# Patient Record
Sex: Female | Born: 1970
Health system: Southern US, Community
[De-identification: ages and names within clinical notes are randomized; demographics above are authoritative.]

## PROBLEM LIST (undated history)

## (undated) DIAGNOSIS — I10 Essential (primary) hypertension: Secondary | ICD-10-CM

## (undated) DIAGNOSIS — J45909 Unspecified asthma, uncomplicated: Secondary | ICD-10-CM

## (undated) DIAGNOSIS — E785 Hyperlipidemia, unspecified: Secondary | ICD-10-CM

## (undated) DIAGNOSIS — E119 Type 2 diabetes mellitus without complications: Secondary | ICD-10-CM

## (undated) DIAGNOSIS — E669 Obesity, unspecified: Secondary | ICD-10-CM

## (undated) DIAGNOSIS — M1990 Unspecified osteoarthritis, unspecified site: Secondary | ICD-10-CM

## (undated) HISTORY — PX: ANAL FISSURE REPAIR: SHX2312

## (undated) HISTORY — PX: TUBAL LIGATION: SHX77

---

## 2013-09-18 ENCOUNTER — Emergency Department (HOSPITAL_COMMUNITY)
Admission: EM | Admit: 2013-09-18 | Discharge: 2013-09-19 | Disposition: A | Discharge status: Home / Self Care | Payer: No Typology Code available for payment source | Attending: Emergency Medicine | Admitting: Emergency Medicine

## 2013-09-18 ENCOUNTER — Encounter (HOSPITAL_COMMUNITY): Payer: Self-pay | Admitting: Emergency Medicine

## 2013-09-18 ENCOUNTER — Emergency Department (HOSPITAL_COMMUNITY): Payer: No Typology Code available for payment source

## 2013-09-18 DIAGNOSIS — R609 Edema, unspecified: Secondary | ICD-10-CM | POA: Insufficient documentation

## 2013-09-18 DIAGNOSIS — Z880 Allergy status to penicillin: Secondary | ICD-10-CM | POA: Insufficient documentation

## 2013-09-18 DIAGNOSIS — R079 Chest pain, unspecified: Secondary | ICD-10-CM

## 2013-09-18 DIAGNOSIS — R0789 Other chest pain: Secondary | ICD-10-CM | POA: Insufficient documentation

## 2013-09-18 DIAGNOSIS — R0602 Shortness of breath: Secondary | ICD-10-CM | POA: Insufficient documentation

## 2013-09-18 DIAGNOSIS — E669 Obesity, unspecified: Secondary | ICD-10-CM | POA: Insufficient documentation

## 2013-09-18 HISTORY — DX: Obesity, unspecified: E66.9

## 2013-09-18 LAB — CBC
HEMATOCRIT: 31.9 % — AB (ref 36.0–46.0)
Hemoglobin: 10.1 g/dL — ABNORMAL LOW (ref 12.0–15.0)
MCH: 27.9 pg (ref 26.0–34.0)
MCHC: 31.7 g/dL (ref 30.0–36.0)
MCV: 88.1 fL (ref 78.0–100.0)
Platelets: 250 10*3/uL (ref 150–400)
RBC: 3.62 MIL/uL — ABNORMAL LOW (ref 3.87–5.11)
RDW: 15.1 % (ref 11.5–15.5)
WBC: 8.8 10*3/uL (ref 4.0–10.5)

## 2013-09-18 LAB — I-STAT TROPONIN, ED: Troponin i, poc: 0.01 ng/mL (ref 0.00–0.08)

## 2013-09-18 LAB — PRO B NATRIURETIC PEPTIDE: Pro B Natriuretic peptide (BNP): 71 pg/mL (ref 0–125)

## 2013-09-18 MED ORDER — ASPIRIN 81 MG PO CHEW
324.0000 mg | CHEWABLE_TABLET | Freq: Once | ORAL | Status: AC
  Administered 2013-09-18: 324 mg via ORAL
  Filled 2013-09-18: qty 4

## 2013-09-18 NOTE — ED Notes (Signed)
Pt. reports intermittent left chest pain with SOB and occasional productive cough onset yesterday . No nausea or diaphoresis .

## 2013-09-18 NOTE — ED Notes (Signed)
Pt ambulated around POD A with slow, steady, balanced gait with no difficulty. Denies nausea or dizziness. O2 97% room air.

## 2013-09-18 NOTE — ED Provider Notes (Signed)
CSN: [id number]     Arrival date & time 09/18/13  2201 History   First MD Initiated Contact with Patient 09/18/13 2301     Chief Complaint  Patient presents with  . Chest Pain     (Consider location/radiation/quality/duration/timing/severity/associated sxs/prior Treatment) HPI Comments: 43 y/o comes in with cc of chest pain, shortness of breath. No medical hx, but reports not seeing a doctor in a while. Chest pain is left sided,   Patient is a 43 y.o. female presenting with chest pain. The history is provided by the patient.  Chest Pain Associated symptoms: no abdominal pain, no headache, no nausea, no shortness of breath and not vomiting     Past Medical History  Diagnosis Date  . Obesity    History reviewed. No pertinent past surgical history. No family history on file. History  Substance Use Topics  . Smoking status: Never Smoker   . Smokeless tobacco: Not on file  . Alcohol Use: No   OB History   Grav Para Term Preterm Abortions TAB SAB Ect Mult Living                 Review of Systems  Constitutional: Positive for activity change.  Respiratory: Negative for shortness of breath.   Cardiovascular: Positive for chest pain.  Gastrointestinal: Negative for nausea, vomiting and abdominal pain.  Genitourinary: Negative for dysuria.  Musculoskeletal: Negative for neck pain.  Neurological: Negative for headaches.  All other systems reviewed and are negative.     Allergies  Penicillins  Home Medications   Prior to Admission medications   Not on File   BP 108/62  Pulse 84  Temp(Src) 99.1 F (37.3 C) (Oral)  Resp 18  Ht 5\' 3"  (1.6 m)  Wt 300 lb (136.079 kg)  BMI 53.16 kg/m2  SpO2 96%  LMP 08/26/2013 Physical Exam  Constitutional: She is oriented to person, place, and time. She appears well-developed and well-nourished.  HENT:  Head: Normocephalic and atraumatic.  Eyes: EOM are normal. Pupils are equal, round, and reactive to light.  Neck: Neck supple.   Cardiovascular: Normal rate, regular rhythm and normal heart sounds.   No murmur heard. Pulmonary/Chest: Effort normal. No respiratory distress. She has no wheezes.  Abdominal: Soft. She exhibits no distension. There is no tenderness. There is no rebound and no guarding.  Musculoskeletal: She exhibits edema.  2+ pitting edema bilaterally  Neurological: She is alert and oriented to person, place, and time.  Skin: Skin is warm and dry.    ED Course  Procedures (including critical care time) Labs Review Labs Reviewed  CBC - Abnormal; Notable for the following:    RBC 3.62 (*)    Hemoglobin 10.1 (*)    HCT 31.9 (*)    All other components within normal limits  PRO B NATRIURETIC PEPTIDE  TROPONIN I  BASIC METABOLIC PANEL  I-STAT TROPOININ, ED    Imaging Review Dg Chest 2 View  09/18/2013   CLINICAL DATA:  Chest pain, shortness of breath, bilateral leg swelling.  EXAM: CHEST  2 VIEW  COMPARISON:  None.  FINDINGS: The heart size and mediastinal contours are within normal limits. Both lungs are clear. The visualized skeletal structures are unremarkable.  IMPRESSION: No active cardiopulmonary disease.   Electronically Signed   By: Lucienne Capers M.D.   On: 09/18/2013 22:59     EKG Interpretation   Date/Time:  Wednesday September 18 2013 22:14:55 EDT Ventricular Rate:  84 PR Interval:  126 QRS Duration: 106  QT Interval:  402 QTC Calculation: 475 R Axis:   28 Text Interpretation:  Normal sinus rhythm Incomplete right bundle branch  block Borderline ECG Confirmed by Kathrynn Humble, MD, Thelma Comp ([id number]) on 09/18/2013  11:38:06 PM      MDM   Final diagnoses:  Chest pain  Pitting edema   Pt comes in with cc of chest pain. Atypical chest pain. HEART score is 1 - for atypical chest pain. She has pitting edema and some exertional dyspnea as well - with the latter starting today, and the former worse today than in the past. Lung exam is clear, and BNP is 71. EKG shows no acute  findings.  Pt has insurance, and PCP - just doesn't see a doctor.  I think the best approach would be for patient to see her pcp, and get an echo. She will get lasix to help with the pitting edema, that i think could be due to venous stasis. She has been advised to use stocking as well. Return to the ER if the symptoms get worse.  Varney Biles, MD 09/19/13 [phone]

## 2013-09-19 LAB — I-STAT CHEM 8, ED
BUN: 10 mg/dL (ref 6–23)
CALCIUM ION: 1.17 mmol/L (ref 1.12–1.23)
CHLORIDE: 96 meq/L (ref 96–112)
Creatinine, Ser: 1 mg/dL (ref 0.50–1.10)
GLUCOSE: 149 mg/dL — AB (ref 70–99)
HCT: 32 % — ABNORMAL LOW (ref 36.0–46.0)
Hemoglobin: 10.9 g/dL — ABNORMAL LOW (ref 12.0–15.0)
Potassium: 3.8 mEq/L (ref 3.7–5.3)
Sodium: 136 mEq/L — ABNORMAL LOW (ref 137–147)
TCO2: 25 mmol/L (ref 0–100)

## 2013-09-19 LAB — TROPONIN I: Troponin I: <0.3 ng/mL (ref <0.30)

## 2013-09-19 LAB — BASIC METABOLIC PANEL
BUN: 10 mg/dL (ref 6–23)
CO2: 24 mEq/L (ref 19–32)
CREATININE: 0.88 mg/dL (ref 0.50–1.10)
Calcium: 9 mg/dL (ref 8.4–10.5)
Chloride: 96 mEq/L (ref 96–112)
GFR, EST NON AFRICAN AMERICAN: 81 mL/min — AB (ref >90)
Glucose, Bld: 144 mg/dL — ABNORMAL HIGH (ref 70–99)
Potassium: 4.1 mEq/L (ref 3.7–5.3)
Sodium: 136 mEq/L — ABNORMAL LOW (ref 137–147)

## 2013-09-19 MED ORDER — ASPIRIN EC 81 MG PO TBEC: 81.0000 mg | DELAYED_RELEASE_TABLET | Freq: Every day | ORAL | Status: DC

## 2013-09-19 MED ORDER — FUROSEMIDE 20 MG PO TABS
20.0000 mg | ORAL_TABLET | Freq: Every day | ORAL | Status: DC
Start: 2013-09-19 — End: 2017-09-01

## 2013-09-19 NOTE — Discharge Instructions (Signed)
We saw you in the ER for the chest pain/shortness of breath. All of our cardiac workup is normal, including labs, EKG and chest X-RAY are normal. We are not sure what is causing your discomfort, or your leg swelling. There is no clear evidence of Congestive Heart Failure. The workup in the ER is not complete, and you should follow up with your primary care doctor for further evaluation. Take the medications prescribed. Return to the ER if the symptoms get worse.   Edema Edema is an abnormal build-up of fluids in tissues. Because this is partly dependent on gravity (water flows to the lowest place), it is more common in the legs and thighs (lower extremities). It is also common in the looser tissues, like around the eyes. Painless swelling of the feet and ankles is common and increases as a person ages. It may affect both legs and may include the calves or even thighs. When squeezed, the fluid may move out of the affected area and may leave a dent for a few moments. CAUSES   Prolonged standing or sitting in one place for extended periods of time. Movement helps pump tissue fluid into the veins, and absence of movement prevents this, resulting in edema.  Varicose veins. The valves in the veins do not work as well as they should. This causes fluid to leak into the tissues.  Fluid and salt overload.  Injury, burn, or surgery to the leg, ankle, or foot, may damage veins and allow fluid to leak out.  Sunburn damages vessels. Leaky vessels allow fluid to go out into the sunburned tissues.  Allergies (from insect bites or stings, medications or chemicals) cause swelling by allowing vessels to become leaky.  Protein in the blood helps keep fluid in your vessels. Low protein, as in malnutrition, allows fluid to leak out.  Hormonal changes, including pregnancy and menstruation, cause fluid retention. This fluid may leak out of vessels and cause edema.  Medications that cause fluid retention.  Examples are sex hormones, blood pressure medications, steroid treatment, or anti-depressants.  Some illnesses cause edema, especially heart failure, kidney disease, or liver disease.  Surgery that cuts veins or lymph nodes, such as surgery done for the heart or for breast cancer, may result in edema. DIAGNOSIS  Your caregiver is usually easily able to determine what is causing your swelling (edema) by simply asking what is wrong (getting a history) and examining you (doing a physical). Sometimes x-rays, EKG (electrocardiogram or heart tracing), and blood work may be done to evaluate for underlying medical illness. TREATMENT  General treatment includes:  Leg elevation (or elevation of the affected body part).  Restriction of fluid intake.  Prevention of fluid overload.  Compression of the affected body part. Compression with elastic bandages or support stockings squeezes the tissues, preventing fluid from entering and forcing it back into the blood vessels.  Diuretics (also called water pills or fluid pills) pull fluid out of your body in the form of increased urination. These are effective in reducing the swelling, but can have side effects and must be used only under your caregiver's supervision. Diuretics are appropriate only for some types of edema. The specific treatment can be directed at any underlying causes discovered. Heart, liver, or kidney disease should be treated appropriately. HOME CARE INSTRUCTIONS   Elevate the legs (or affected body part) above the level of the heart, while lying down.  Avoid sitting or standing still for prolonged periods of time.  Avoid putting anything directly  under the knees when lying down, and do not wear constricting clothing or garters on the upper legs.  Exercising the legs causes the fluid to work back into the veins and lymphatic channels. This may help the swelling go down.  The pressure applied by elastic bandages or support stockings can  help reduce ankle swelling.  A low-salt diet may help reduce fluid retention and decrease the ankle swelling.  Take any medications exactly as prescribed. SEEK MEDICAL CARE IF:  Your edema is not responding to recommended treatments. SEEK IMMEDIATE MEDICAL CARE IF:   You develop shortness of breath or chest pain.  You cannot breathe when you lay down; or if, while lying down, you have to get up and go to the window to get your breath.  You are having increasing swelling without relief from treatment.  You develop a fever over 102 F (38.9 C).  You develop pain or redness in the areas that are swollen.  Tell your caregiver right away if you have gained 03 lb/1.4 kg in 1 day or 05 lb/2.3 kg in a week. MAKE SURE YOU:   Understand these instructions.  Will watch your condition.  Will get help right away if you are not doing well or get worse. Document Released: 05/16/2005 Document Revised: 11/15/2011 Document Reviewed: 01/02/2008 [facility] Patient Information 2014 [place].  Chest Pain (Nonspecific) It is often hard to give a specific diagnosis for the cause of chest pain. There is always a chance that your pain could be related to something serious, such as a heart attack or a blood clot in the lungs. You need to follow up with your caregiver for further evaluation. CAUSES   Heartburn.  Pneumonia or bronchitis.  Anxiety or stress.  Inflammation around your heart (pericarditis) or lung (pleuritis or pleurisy).  A blood clot in the lung.  A collapsed lung (pneumothorax). It can develop suddenly on its own (spontaneous pneumothorax) or from injury (trauma) to the chest.  Shingles infection (herpes zoster virus). The chest wall is composed of bones, muscles, and cartilage. Any of these can be the source of the pain.  The bones can be bruised by injury.  The muscles or cartilage can be strained by coughing or overwork.  The cartilage can be affected by inflammation  and become sore (costochondritis). DIAGNOSIS  Lab tests or other studies, such as X-rays, electrocardiography, stress testing, or cardiac imaging, may be needed to find the cause of your pain.  TREATMENT   Treatment depends on what may be causing your chest pain. Treatment may include:  Acid blockers for heartburn.  Anti-inflammatory medicine.  Pain medicine for inflammatory conditions.  Antibiotics if an infection is present.  You may be advised to change lifestyle habits. This includes stopping smoking and avoiding alcohol, caffeine, and chocolate.  You may be advised to keep your head raised (elevated) when sleeping. This reduces the chance of acid going backward from your stomach into your esophagus.  Most of the time, nonspecific chest pain will improve within 2 to 3 days with rest and mild pain medicine. HOME CARE INSTRUCTIONS   If antibiotics were prescribed, take your antibiotics as directed. Finish them even if you start to feel better.  For the next few days, avoid physical activities that bring on chest pain. Continue physical activities as directed.  Do not smoke.  Avoid drinking alcohol.  Only take over-the-counter or prescription medicine for pain, discomfort, or fever as directed by your caregiver.  Follow your caregiver's suggestions  for further testing if your chest pain does not go away.  Keep any follow-up appointments you made. If you do not go to an appointment, you could develop lasting (chronic) problems with pain. If there is any problem keeping an appointment, you must call to reschedule. SEEK MEDICAL CARE IF:   You think you are having problems from the medicine you are taking. Read your medicine instructions carefully.  Your chest pain does not go away, even after treatment.  You develop a rash with blisters on your chest. SEEK IMMEDIATE MEDICAL CARE IF:   You have increased chest pain or pain that spreads to your arm, neck, jaw, back, or  abdomen.  You develop shortness of breath, an increasing cough, or you are coughing up blood.  You have severe back or abdominal pain, feel nauseous, or vomit.  You develop severe weakness, fainting, or chills.  You have a fever. THIS IS AN EMERGENCY. Do not wait to see if the pain will go away. Get medical help at once. Call your local emergency services (911 in [place] Do not drive yourself to the hospital. MAKE SURE YOU:   Understand these instructions.  Will watch your condition.  Will get help right away if you are not doing well or get worse. Document Released: 02/23/2005 Document Revised: 08/08/2011 Document Reviewed: 12/20/2007 [facility] Patient Information 2014 [place].

## 2013-09-19 NOTE — ED Notes (Signed)
Pt A&OX4, ambulatory at discharge with slow, steady and balanced gait, verbalizing no complaints at this time.

## 2013-09-25 ENCOUNTER — Other Ambulatory Visit (HOSPITAL_COMMUNITY): Payer: Self-pay | Admitting: Family Medicine

## 2013-09-25 DIAGNOSIS — R0602 Shortness of breath: Secondary | ICD-10-CM

## 2013-10-03 ENCOUNTER — Ambulatory Visit (HOSPITAL_COMMUNITY)
Admission: RE | Admit: 2013-10-03 | Discharge: 2013-10-03 | Disposition: A | Discharge status: Home / Self Care | Payer: No Typology Code available for payment source | Source: Ambulatory Visit | Attending: Family Medicine | Admitting: Family Medicine

## 2013-10-03 DIAGNOSIS — R0609 Other forms of dyspnea: Secondary | ICD-10-CM | POA: Insufficient documentation

## 2013-10-03 DIAGNOSIS — R079 Chest pain, unspecified: Secondary | ICD-10-CM | POA: Insufficient documentation

## 2013-10-03 DIAGNOSIS — R0989 Other specified symptoms and signs involving the circulatory and respiratory systems: Secondary | ICD-10-CM | POA: Insufficient documentation

## 2013-10-03 DIAGNOSIS — R0602 Shortness of breath: Secondary | ICD-10-CM

## 2013-10-03 NOTE — Progress Notes (Signed)
  Echocardiogram 2D Echocardiogram has been performed.  [place] 10/03/2013, 4:11 PM

## 2016-02-24 ENCOUNTER — Encounter (HOSPITAL_COMMUNITY): Payer: Self-pay | Admitting: Emergency Medicine

## 2016-02-24 ENCOUNTER — Emergency Department (HOSPITAL_COMMUNITY): Payer: No Typology Code available for payment source

## 2016-02-24 ENCOUNTER — Emergency Department (HOSPITAL_COMMUNITY)
Admission: EM | Admit: 2016-02-24 | Discharge: 2016-02-24 | Disposition: A | Discharge status: Home / Self Care | Payer: No Typology Code available for payment source | Attending: Emergency Medicine | Admitting: Emergency Medicine

## 2016-02-24 DIAGNOSIS — I10 Essential (primary) hypertension: Secondary | ICD-10-CM | POA: Insufficient documentation

## 2016-02-24 DIAGNOSIS — Z7984 Long term (current) use of oral hypoglycemic drugs: Secondary | ICD-10-CM | POA: Insufficient documentation

## 2016-02-24 DIAGNOSIS — E1165 Type 2 diabetes mellitus with hyperglycemia: Secondary | ICD-10-CM | POA: Insufficient documentation

## 2016-02-24 DIAGNOSIS — Z7982 Long term (current) use of aspirin: Secondary | ICD-10-CM | POA: Insufficient documentation

## 2016-02-24 DIAGNOSIS — R0789 Other chest pain: Secondary | ICD-10-CM | POA: Insufficient documentation

## 2016-02-24 DIAGNOSIS — Z87891 Personal history of nicotine dependence: Secondary | ICD-10-CM | POA: Insufficient documentation

## 2016-02-24 DIAGNOSIS — Z79899 Other long term (current) drug therapy: Secondary | ICD-10-CM | POA: Insufficient documentation

## 2016-02-24 HISTORY — DX: Unspecified osteoarthritis, unspecified site: M19.90

## 2016-02-24 HISTORY — DX: Type 2 diabetes mellitus without complications: E11.9

## 2016-02-24 HISTORY — DX: Essential (primary) hypertension: I10

## 2016-02-24 HISTORY — DX: Hyperlipidemia, unspecified: E78.5

## 2016-02-24 LAB — BASIC METABOLIC PANEL
Anion gap: 8 (ref 5–15)
BUN: <5 mg/dL — ABNORMAL LOW (ref 6–20)
CHLORIDE: 96 mmol/L — AB (ref 101–111)
CO2: 28 mmol/L (ref 22–32)
CREATININE: 0.75 mg/dL (ref 0.44–1.00)
Calcium: 9.2 mg/dL (ref 8.9–10.3)
Glucose, Bld: 340 mg/dL — ABNORMAL HIGH (ref 65–99)
Potassium: 3.9 mmol/L (ref 3.5–5.1)
SODIUM: 132 mmol/L — AB (ref 135–145)

## 2016-02-24 LAB — I-STAT TROPONIN, ED: TROPONIN I, POC: 0 ng/mL (ref 0.00–0.08)

## 2016-02-24 LAB — CBC
HCT: 33.4 % — ABNORMAL LOW (ref 36.0–46.0)
Hemoglobin: 10.4 g/dL — ABNORMAL LOW (ref 12.0–15.0)
MCH: 28 pg (ref 26.0–34.0)
MCHC: 31.1 g/dL (ref 30.0–36.0)
MCV: 89.8 fL (ref 78.0–100.0)
Platelets: 241 10*3/uL (ref 150–400)
RBC: 3.72 MIL/uL — AB (ref 3.87–5.11)
RDW: 14.7 % (ref 11.5–15.5)
WBC: 8.1 10*3/uL (ref 4.0–10.5)

## 2016-02-24 LAB — D-DIMER, QUANTITATIVE: D-Dimer, Quant: 0.37 ug/mL-FEU (ref 0.00–0.50)

## 2016-02-24 MED ORDER — METFORMIN HCL 500 MG PO TABS: 500.0000 mg | ORAL_TABLET | Freq: Two times a day (BID) | ORAL | 0 refills | Status: DC

## 2016-02-24 MED ORDER — HYDROCHLOROTHIAZIDE 25 MG PO TABS: 25.0000 mg | ORAL_TABLET | Freq: Every day | ORAL | 0 refills | Status: DC

## 2016-02-24 NOTE — ED Provider Notes (Signed)
[facility] DEPT Provider Note   CSN: [id number] Arrival date & time: 02/24/16  1526     History   Chief Complaint Chief Complaint  Patient presents with  . Chest Pain    HPI Desiree Hernandez is a 45 y.o. female.  Chest pain described as heaviness for the past several days c associated bitemporal headache. She complains of dyspnea with exertion but no diaphoresis or nausea. She has hypertension and diabetes but is non-compliant with her medications secondary to financial concerns. Past medical history includes hyperlipidemia and obesity.      Past Medical History:  Diagnosis Date  . Arthritis   . Diabetes mellitus without complication ([place])   . Hyperlipidemia   . Hypertension   . Obesity     There are no active problems to display for this patient.   Past Surgical History:  Procedure Laterality Date  . ANAL FISSURE REPAIR    . TUBAL LIGATION      OB History    No data available       Home Medications    Prior to Admission medications   Medication Sig Start Date End Date Taking? Authorizing Provider  albuterol (VENTOLIN HFA) 108 (90 Base) MCG/ACT inhaler Inhale 1 puff into the lungs every 6 (six) hours as needed for wheezing or shortness of breath.   Yes Historical Provider, MD  ibuprofen (ADVIL,MOTRIN) 200 MG tablet Take 400 mg by mouth every 6 (six) hours as needed (for pain).   Yes Historical Provider, MD  aspirin EC 81 MG tablet Take 1 tablet (81 mg total) by mouth daily. Patient not taking: Reported on 02/24/2016 09/19/13   Varney Biles, MD  furosemide (LASIX) 20 MG tablet Take 1 tablet (20 mg total) by mouth daily. Patient not taking: Reported on 02/24/2016 09/19/13   Varney Biles, MD  hydrochlorothiazide (HYDRODIURIL) 25 MG tablet Take 1 tablet (25 mg total) by mouth daily. 02/24/16   Nat Christen, MD  metFORMIN (GLUCOPHAGE) 500 MG tablet Take 1 tablet (500 mg total) by mouth 2 (two) times daily with a meal. 02/24/16   Nat Christen, MD    Family  History History reviewed. No pertinent family history.  Social History Social History  Substance Use Topics  . Smoking status: Former Smoker    Types: Cigarettes  . Smokeless tobacco: Never Used  . Alcohol use Yes     Comment: occassional     Allergies   Penicillins   Review of Systems Review of Systems  All other systems reviewed and are negative.    Physical Exam Updated Vital Signs BP 134/93   Pulse 72   Temp 98.5 F (36.9 C) (Oral)   Resp 15   Ht 5\' 3"  (1.6 m)   LMP 02/22/2016   SpO2 95%   Physical Exam  Constitutional: She is oriented to person, place, and time.  Obese, no acute distress  HENT:  Head: Normocephalic and atraumatic.  Eyes: Conjunctivae are normal.  Neck: Neck supple.  Cardiovascular: Normal rate and regular rhythm.   Pulmonary/Chest: Effort normal and breath sounds normal.  Abdominal: Soft. Bowel sounds are normal.  Musculoskeletal: Normal range of motion.  Neurological: She is alert and oriented to person, place, and time.  Skin: Skin is warm and dry.  Psychiatric: She has a normal mood and affect. Her behavior is normal.  Nursing note and vitals reviewed.    ED Treatments / Results  Labs (all labs ordered are listed, but only abnormal results are displayed) Labs Reviewed  BASIC  METABOLIC PANEL - Abnormal; Notable for the following:       Result Value   Sodium 132 (*)    Chloride 96 (*)    Glucose, Bld 340 (*)    BUN <5 (*)    All other components within normal limits  CBC - Abnormal; Notable for the following:    RBC 3.72 (*)    Hemoglobin 10.4 (*)    HCT 33.4 (*)    All other components within normal limits  D-DIMER, QUANTITATIVE (NOT AT [facility])  Randolm Idol, ED    EKG  EKG Interpretation  Date/Time:  Wednesday February 24 2016 15:37:59 EDT Ventricular Rate:  84 PR Interval:  134 QRS Duration: 106 QT Interval:  380 QTC Calculation: 449 R Axis:   13 Text Interpretation:  Normal sinus rhythm Normal ECG  Confirmed by Lacinda Axon  MD, Thamara Leger ([id number]) on 02/24/2016 9:21:46 PM       Radiology Dg Chest 2 View  Result Date: 02/24/2016 CLINICAL DATA:  Upper chest pain and shortness of breath for 2 days. EXAM: CHEST  2 VIEW COMPARISON:  09/18/2013 FINDINGS: The heart size and pulmonary vascularity are normal. Slight peribronchial thickening. The lungs are otherwise clear. No bone abnormality. No effusions. IMPRESSION: Slight bronchitic changes. Electronically Signed   By: Lorriane Shire M.D.   On: 02/24/2016 16:01    Procedures Procedures (including critical care time)  Medications Ordered in ED Medications - No data to display   Initial Impression / Assessment and Plan / ED Course  I have reviewed the triage vital signs and the nursing notes.  Pertinent labs & imaging results that were available during my care of the patient were reviewed by me and considered in my medical decision making (see chart for details).  Clinical Course    Patient has multiple cardiac risk factors. Workup including EKG, chest x-ray, troponin all negative. Glucose elevated. These findings were discussed in great detail with the patient and her mother and daughter. Patient understands she is at high risk for a cardiac event. Will start patient on metformin and hydrochlorothiazide. She will get primary care follow-up and/or return if worse.  Final Clinical Impressions(s) / ED Diagnoses   Final diagnoses:  Essential hypertension  Type 2 diabetes mellitus with hyperglycemia, without long-term current use of insulin (HCC)    New Prescriptions New Prescriptions   HYDROCHLOROTHIAZIDE (HYDRODIURIL) 25 MG TABLET    Take 1 tablet (25 mg total) by mouth daily.   METFORMIN (GLUCOPHAGE) 500 MG TABLET    Take 1 tablet (500 mg total) by mouth 2 (two) times daily with a meal.     Nat Christen, MD 02/24/16 [phone]

## 2016-02-24 NOTE — Discharge Instructions (Signed)
Will start medication for diabetes and hypertension. Prescriptions are for 1 month. You must get primary care follow-up. Try to work on your weight. Return if chest pain worsens.

## 2016-02-24 NOTE — ED Triage Notes (Signed)
Pt states she has been having chest pain (heaviness) over the past few days and headache in her temples. Pt has hx of HTN. Pt states she feels SOB. Talking in complete sentences. Pt seen at urgent care and sent here for inconclusive EKG

## 2017-07-23 ENCOUNTER — Encounter (HOSPITAL_COMMUNITY): Payer: Self-pay | Admitting: Emergency Medicine

## 2017-07-23 ENCOUNTER — Emergency Department (HOSPITAL_COMMUNITY): Payer: No Typology Code available for payment source

## 2017-07-23 ENCOUNTER — Emergency Department (HOSPITAL_COMMUNITY)
Admission: EM | Admit: 2017-07-23 | Discharge: 2017-07-23 | Disposition: A | Discharge status: Home / Self Care | Payer: No Typology Code available for payment source | Attending: Emergency Medicine | Admitting: Emergency Medicine

## 2017-07-23 DIAGNOSIS — E119 Type 2 diabetes mellitus without complications: Secondary | ICD-10-CM | POA: Diagnosis not present

## 2017-07-23 DIAGNOSIS — Y939 Activity, unspecified: Secondary | ICD-10-CM | POA: Diagnosis not present

## 2017-07-23 DIAGNOSIS — Z79899 Other long term (current) drug therapy: Secondary | ICD-10-CM | POA: Diagnosis not present

## 2017-07-23 DIAGNOSIS — Z7984 Long term (current) use of oral hypoglycemic drugs: Secondary | ICD-10-CM | POA: Insufficient documentation

## 2017-07-23 DIAGNOSIS — Z7982 Long term (current) use of aspirin: Secondary | ICD-10-CM | POA: Insufficient documentation

## 2017-07-23 DIAGNOSIS — I10 Essential (primary) hypertension: Secondary | ICD-10-CM | POA: Diagnosis not present

## 2017-07-23 DIAGNOSIS — S0081XA Abrasion of other part of head, initial encounter: Secondary | ICD-10-CM | POA: Diagnosis not present

## 2017-07-23 DIAGNOSIS — Z87891 Personal history of nicotine dependence: Secondary | ICD-10-CM | POA: Insufficient documentation

## 2017-07-23 DIAGNOSIS — S27321A Contusion of lung, unilateral, initial encounter: Secondary | ICD-10-CM | POA: Diagnosis not present

## 2017-07-23 DIAGNOSIS — Y999 Unspecified external cause status: Secondary | ICD-10-CM | POA: Diagnosis not present

## 2017-07-23 DIAGNOSIS — S299XXA Unspecified injury of thorax, initial encounter: Secondary | ICD-10-CM | POA: Diagnosis present

## 2017-07-23 DIAGNOSIS — Y929 Unspecified place or not applicable: Secondary | ICD-10-CM | POA: Diagnosis not present

## 2017-07-23 LAB — I-STAT CHEM 8, ED
BUN: 11 mg/dL (ref 6–20)
CALCIUM ION: 1.06 mmol/L — AB (ref 1.15–1.40)
Chloride: 96 mmol/L — ABNORMAL LOW (ref 101–111)
Creatinine, Ser: 0.7 mg/dL (ref 0.44–1.00)
GLUCOSE: 284 mg/dL — AB (ref 65–99)
HCT: 31 % — ABNORMAL LOW (ref 36.0–46.0)
Hemoglobin: 10.5 g/dL — ABNORMAL LOW (ref 12.0–15.0)
Potassium: 4.3 mmol/L (ref 3.5–5.1)
Sodium: 132 mmol/L — ABNORMAL LOW (ref 135–145)
TCO2: 25 mmol/L (ref 22–32)

## 2017-07-23 LAB — BASIC METABOLIC PANEL
Anion gap: 11 (ref 5–15)
BUN: 10 mg/dL (ref 6–20)
CALCIUM: 8.7 mg/dL — AB (ref 8.9–10.3)
CO2: 22 mmol/L (ref 22–32)
CREATININE: 0.79 mg/dL (ref 0.44–1.00)
Chloride: 95 mmol/L — ABNORMAL LOW (ref 101–111)
GFR calc Af Amer: >60 mL/min (ref >60)
Glucose, Bld: 275 mg/dL — ABNORMAL HIGH (ref 65–99)
POTASSIUM: 4.3 mmol/L (ref 3.5–5.1)
Sodium: 128 mmol/L — ABNORMAL LOW (ref 135–145)

## 2017-07-23 LAB — CBC
HCT: 30.5 % — ABNORMAL LOW (ref 36.0–46.0)
Hemoglobin: 9.5 g/dL — ABNORMAL LOW (ref 12.0–15.0)
MCH: 27.6 pg (ref 26.0–34.0)
MCHC: 31.1 g/dL (ref 30.0–36.0)
MCV: 88.7 fL (ref 78.0–100.0)
PLATELETS: 259 10*3/uL (ref 150–400)
RBC: 3.44 MIL/uL — AB (ref 3.87–5.11)
RDW: 15.8 % — AB (ref 11.5–15.5)
WBC: 8.8 10*3/uL (ref 4.0–10.5)

## 2017-07-23 LAB — I-STAT BETA HCG BLOOD, ED (MC, WL, AP ONLY)

## 2017-07-23 MED ORDER — IOPAMIDOL (ISOVUE-300) INJECTION 61%
INTRAVENOUS | Status: AC
  Administered 2017-07-23: 150 mL
  Filled 2017-07-23: qty 150

## 2017-07-23 MED ORDER — TRAMADOL HCL 50 MG PO TABS: 50.0000 mg | ORAL_TABLET | Freq: Two times a day (BID) | ORAL | 0 refills | Status: DC | PRN

## 2017-07-23 MED ORDER — ACETAMINOPHEN 500 MG PO TABS
1000.0000 mg | ORAL_TABLET | Freq: Once | ORAL | Status: AC
Start: 2017-07-23 — End: 2017-07-23
  Administered 2017-07-23: 1000 mg via ORAL
  Filled 2017-07-23: qty 2

## 2017-07-23 NOTE — ED Notes (Signed)
Patient transported to CT 

## 2017-07-23 NOTE — ED Notes (Signed)
ED Provider at bedside. 
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
## 2017-07-23 NOTE — ED Notes (Signed)
Pt understood dc material. NAD Noted. Scripts and work excuse given at [employer]
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
## 2017-07-23 NOTE — Discharge Instructions (Signed)
Take ibuprofen 3 times a day with meals. Take 600 mg (3 pills) at a time.  Do not take other anti-inflammatories at the same time open (Advil, Motrin, naproxen, Aleve).  Take Tylenol 3 times a day. You may take up to 1000 mg with each dose.  Take tramadol as needed for severe pain.  Use the incentive spirometry at least 5 times a day.  Use ice packs or heating pads if this helps control your pain. You will likely have continued muscle stiffness and soreness over the next couple days.   Return to the emergency room if you develop fevers, difficulty breathing, worsening cough, vision changes, vomiting, slurred speech, numbness, loss of bowel or bladder control, or any new or worsening symptoms.

## 2017-07-23 NOTE — ED Notes (Signed)
Discharge vitals in, IV removed and pt getting dressed.
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
## 2017-07-23 NOTE — ED Notes (Signed)
Pt used incentive spirometer x3 at 1100-1250 mL. Pt instructed on at home use.

## 2017-07-29 ENCOUNTER — Emergency Department (HOSPITAL_COMMUNITY)
Admission: EM | Admit: 2017-07-29 | Discharge: 2017-07-29 | Disposition: A | Discharge status: Home / Self Care | Payer: No Typology Code available for payment source | Attending: Emergency Medicine | Admitting: Emergency Medicine

## 2017-07-29 ENCOUNTER — Encounter (HOSPITAL_COMMUNITY): Payer: Self-pay | Admitting: Emergency Medicine

## 2017-07-29 ENCOUNTER — Emergency Department (HOSPITAL_BASED_OUTPATIENT_CLINIC_OR_DEPARTMENT_OTHER)
Admit: 2017-07-29 | Discharge: 2017-07-29 | Disposition: A | Discharge status: Home / Self Care | Payer: No Typology Code available for payment source | Attending: Emergency Medicine | Admitting: Emergency Medicine

## 2017-07-29 ENCOUNTER — Emergency Department (HOSPITAL_COMMUNITY): Payer: No Typology Code available for payment source

## 2017-07-29 ENCOUNTER — Other Ambulatory Visit: Payer: Self-pay

## 2017-07-29 DIAGNOSIS — Y999 Unspecified external cause status: Secondary | ICD-10-CM | POA: Insufficient documentation

## 2017-07-29 DIAGNOSIS — Z79899 Other long term (current) drug therapy: Secondary | ICD-10-CM | POA: Diagnosis not present

## 2017-07-29 DIAGNOSIS — Z87891 Personal history of nicotine dependence: Secondary | ICD-10-CM | POA: Diagnosis not present

## 2017-07-29 DIAGNOSIS — Y939 Activity, unspecified: Secondary | ICD-10-CM | POA: Insufficient documentation

## 2017-07-29 DIAGNOSIS — S42102A Fracture of unspecified part of scapula, left shoulder, initial encounter for closed fracture: Secondary | ICD-10-CM | POA: Diagnosis not present

## 2017-07-29 DIAGNOSIS — Z7982 Long term (current) use of aspirin: Secondary | ICD-10-CM | POA: Diagnosis not present

## 2017-07-29 DIAGNOSIS — J918 Pleural effusion in other conditions classified elsewhere: Secondary | ICD-10-CM | POA: Diagnosis not present

## 2017-07-29 DIAGNOSIS — Y929 Unspecified place or not applicable: Secondary | ICD-10-CM | POA: Diagnosis not present

## 2017-07-29 DIAGNOSIS — M79604 Pain in right leg: Secondary | ICD-10-CM

## 2017-07-29 DIAGNOSIS — J45909 Unspecified asthma, uncomplicated: Secondary | ICD-10-CM | POA: Diagnosis not present

## 2017-07-29 DIAGNOSIS — Z7984 Long term (current) use of oral hypoglycemic drugs: Secondary | ICD-10-CM | POA: Diagnosis not present

## 2017-07-29 DIAGNOSIS — M7989 Other specified soft tissue disorders: Secondary | ICD-10-CM

## 2017-07-29 DIAGNOSIS — I10 Essential (primary) hypertension: Secondary | ICD-10-CM | POA: Diagnosis not present

## 2017-07-29 DIAGNOSIS — R079 Chest pain, unspecified: Secondary | ICD-10-CM | POA: Diagnosis present

## 2017-07-29 DIAGNOSIS — E119 Type 2 diabetes mellitus without complications: Secondary | ICD-10-CM | POA: Insufficient documentation

## 2017-07-29 DIAGNOSIS — R0789 Other chest pain: Secondary | ICD-10-CM

## 2017-07-29 DIAGNOSIS — S27321D Contusion of lung, unilateral, subsequent encounter: Secondary | ICD-10-CM

## 2017-07-29 DIAGNOSIS — J90 Pleural effusion, not elsewhere classified: Secondary | ICD-10-CM

## 2017-07-29 HISTORY — DX: Unspecified asthma, uncomplicated: J45.909

## 2017-07-29 LAB — CBC
HEMATOCRIT: 28.7 % — AB (ref 36.0–46.0)
HEMOGLOBIN: 9 g/dL — AB (ref 12.0–15.0)
MCH: 27.7 pg (ref 26.0–34.0)
MCHC: 31.4 g/dL (ref 30.0–36.0)
MCV: 88.3 fL (ref 78.0–100.0)
Platelets: 298 10*3/uL (ref 150–400)
RBC: 3.25 MIL/uL — ABNORMAL LOW (ref 3.87–5.11)
RDW: 15.4 % (ref 11.5–15.5)
WBC: 5.3 10*3/uL (ref 4.0–10.5)

## 2017-07-29 LAB — COMPREHENSIVE METABOLIC PANEL
ALBUMIN: 3 g/dL — AB (ref 3.5–5.0)
ALT: 12 U/L — ABNORMAL LOW (ref 14–54)
AST: 19 U/L (ref 15–41)
Alkaline Phosphatase: 43 U/L (ref 38–126)
Anion gap: 11 (ref 5–15)
BUN: 9 mg/dL (ref 6–20)
CHLORIDE: 94 mmol/L — AB (ref 101–111)
CO2: 24 mmol/L (ref 22–32)
Calcium: 8.9 mg/dL (ref 8.9–10.3)
Creatinine, Ser: 0.76 mg/dL (ref 0.44–1.00)
GFR calc Af Amer: >60 mL/min (ref >60)
GFR calc non Af Amer: >60 mL/min (ref >60)
GLUCOSE: 287 mg/dL — AB (ref 65–99)
POTASSIUM: 3.9 mmol/L (ref 3.5–5.1)
SODIUM: 129 mmol/L — AB (ref 135–145)
Total Bilirubin: 0.3 mg/dL (ref 0.3–1.2)
Total Protein: 9.1 g/dL — ABNORMAL HIGH (ref 6.5–8.1)

## 2017-07-29 LAB — I-STAT TROPONIN, ED: Troponin i, poc: 0 ng/mL (ref 0.00–0.08)

## 2017-07-29 LAB — I-STAT BETA HCG BLOOD, ED (MC, WL, AP ONLY): I-stat hCG, quantitative: <5 m[IU]/mL (ref <5)

## 2017-07-29 MED ORDER — MORPHINE SULFATE (PF) 4 MG/ML IV SOLN
4.0000 mg | Freq: Once | INTRAVENOUS | Status: AC
Start: 2017-07-29 — End: 2017-07-29
  Administered 2017-07-29: 4 mg via INTRAVENOUS
  Filled 2017-07-29: qty 1

## 2017-07-29 MED ORDER — OXYCODONE-ACETAMINOPHEN 5-325 MG PO TABS: 1.0000 | ORAL_TABLET | Freq: Three times a day (TID) | ORAL | 0 refills | Status: DC | PRN

## 2017-07-29 MED ORDER — OXYCODONE-ACETAMINOPHEN 5-325 MG PO TABS
2.0000 | ORAL_TABLET | Freq: Once | ORAL | Status: AC
  Administered 2017-07-29: 2 via ORAL
  Filled 2017-07-29: qty 2

## 2017-07-29 MED ORDER — SODIUM CHLORIDE 0.9 % IV BOLUS (SEPSIS)
1000.0000 mL | Freq: Once | INTRAVENOUS | Status: AC
  Administered 2017-07-29: 1000 mL via INTRAVENOUS

## 2017-07-29 MED ORDER — HYDROCODONE-ACETAMINOPHEN 5-325 MG PO TABS: 1.0000 | ORAL_TABLET | Freq: Three times a day (TID) | ORAL | 0 refills | Status: DC | PRN

## 2017-07-29 MED ORDER — DOXYCYCLINE HYCLATE 100 MG PO CAPS: 100.0000 mg | ORAL_CAPSULE | Freq: Two times a day (BID) | ORAL | 0 refills | Status: AC

## 2017-07-29 MED ORDER — IOPAMIDOL (ISOVUE-370) INJECTION 76%
INTRAVENOUS | Status: AC
  Administered 2017-07-29: 100 mL
  Filled 2017-07-29: qty 100

## 2017-07-29 MED ORDER — ACETAMINOPHEN 325 MG PO TABS
650.0000 mg | ORAL_TABLET | Freq: Once | ORAL | Status: AC
Start: 2017-07-29 — End: 2017-07-29
  Administered 2017-07-29: 650 mg via ORAL
  Filled 2017-07-29: qty 2

## 2017-07-29 MED ORDER — DOXYCYCLINE HYCLATE 100 MG PO TABS
100.0000 mg | ORAL_TABLET | Freq: Once | ORAL | Status: AC
Start: 2017-07-29 — End: 2017-07-29
  Administered 2017-07-29: 100 mg via ORAL
  Filled 2017-07-29: qty 1

## 2017-07-29 MED ORDER — DOXYCYCLINE HYCLATE 100 MG PO CAPS: 100.0000 mg | ORAL_CAPSULE | Freq: Two times a day (BID) | ORAL | 0 refills | Status: DC
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
## 2017-07-29 NOTE — Progress Notes (Signed)
VASCULAR LAB PRELIMINARY  PRELIMINARY  PRELIMINARY  PRELIMINARY  Right lower extremity venous duplex completed.    Preliminary report:  There is no DVT or SVT noted in the right lower extremity.  Called results to [employer], PA-C  [place], RVT 07/29/2017, 3:28 PM
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
## 2017-07-29 NOTE — Discharge Instructions (Signed)
Please follow-up with Dr. Lorin Mercy in orthopedics for further evaluation and treatment of your scapular fracture. You were given Pain medication which you may take every 8 hours for pain.  Do not drive, work, or operate machinery while you are taking this medication as it can make you drowsy.  You may also continue to take Tylenol and ibuprofen as well for your symptoms, however do not take more than 4000 g of Tylenol in a day.  Please follow-up with your primary care doctor within 1 week as well to check resolution of your symptoms.  Please continue to use your incentive spirometer please also take the antibiotics that you were prescribed until you are completely finished.  I have prescribed a new medication for you today. It is important that when you pick the prescription up you discuss the potential interactions of this medication with other medications you are taking, including over the counter medications, with the pharmacists.   This new medication has potential side effects. Be sure to contact your primary care provider or return to the emergency department if you are experiencing new symptoms that you are unable to tolerate after starting the medication. You need to receive medical evaluation immediately if you start to experience blistering of the skin, rash, swelling, or difficulty breathing as these signs could indicate a more serious medication side effect.   Please return to the emergency department for any chest pain, worsening shortness of breath, worsening pain, fevers, worsening swelling in her legs, or any new or worsening symptoms.

## 2017-08-04 ENCOUNTER — Ambulatory Visit (INDEPENDENT_AMBULATORY_CARE_PROVIDER_SITE_OTHER): Payer: Self-pay | Admitting: Orthopaedic Surgery

## 2017-08-04 ENCOUNTER — Ambulatory Visit (INDEPENDENT_AMBULATORY_CARE_PROVIDER_SITE_OTHER): Payer: Self-pay

## 2017-08-04 ENCOUNTER — Encounter (INDEPENDENT_AMBULATORY_CARE_PROVIDER_SITE_OTHER): Payer: Self-pay | Admitting: Orthopaedic Surgery

## 2017-08-04 VITALS — BP 143/87 | HR 93 | Ht 63.0 in | Wt 250.0 lb

## 2017-08-04 DIAGNOSIS — S40012A Contusion of left shoulder, initial encounter: Secondary | ICD-10-CM

## 2017-08-04 DIAGNOSIS — M25512 Pain in left shoulder: Secondary | ICD-10-CM
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
## 2017-08-10 ENCOUNTER — Other Ambulatory Visit: Payer: Self-pay

## 2017-08-10 ENCOUNTER — Ambulatory Visit: Payer: Self-pay | Attending: Family Medicine | Admitting: Physician Assistant

## 2017-08-10 VITALS — BP 120/84 | HR 89 | Temp 98.4°F | Resp 16 | Ht 63.0 in | Wt 252.2 lb

## 2017-08-10 DIAGNOSIS — E119 Type 2 diabetes mellitus without complications: Secondary | ICD-10-CM

## 2017-08-10 DIAGNOSIS — Z7984 Long term (current) use of oral hypoglycemic drugs: Secondary | ICD-10-CM | POA: Insufficient documentation

## 2017-08-10 DIAGNOSIS — Z7982 Long term (current) use of aspirin: Secondary | ICD-10-CM | POA: Insufficient documentation

## 2017-08-10 DIAGNOSIS — E1165 Type 2 diabetes mellitus with hyperglycemia: Secondary | ICD-10-CM | POA: Insufficient documentation

## 2017-08-10 DIAGNOSIS — E871 Hypo-osmolality and hyponatremia: Secondary | ICD-10-CM

## 2017-08-10 DIAGNOSIS — Z87828 Personal history of other (healed) physical injury and trauma: Secondary | ICD-10-CM | POA: Insufficient documentation

## 2017-08-10 DIAGNOSIS — Z8249 Family history of ischemic heart disease and other diseases of the circulatory system: Secondary | ICD-10-CM | POA: Insufficient documentation

## 2017-08-10 DIAGNOSIS — R9389 Abnormal findings on diagnostic imaging of other specified body structures: Secondary | ICD-10-CM

## 2017-08-10 DIAGNOSIS — M1990 Unspecified osteoarthritis, unspecified site: Secondary | ICD-10-CM | POA: Insufficient documentation

## 2017-08-10 DIAGNOSIS — I2510 Atherosclerotic heart disease of native coronary artery without angina pectoris: Secondary | ICD-10-CM | POA: Insufficient documentation

## 2017-08-10 DIAGNOSIS — E785 Hyperlipidemia, unspecified: Secondary | ICD-10-CM | POA: Insufficient documentation

## 2017-08-10 DIAGNOSIS — J45909 Unspecified asthma, uncomplicated: Secondary | ICD-10-CM | POA: Insufficient documentation

## 2017-08-10 LAB — POCT CBG (FASTING - GLUCOSE)-MANUAL ENTRY: GLUCOSE FASTING, POC: 301 mg/dL — AB (ref 70–99)

## 2017-08-10 MED ORDER — TRUEPLUS LANCETS 28G MISC: 28.0000 g | Freq: Four times a day (QID) | 3 refills | Status: AC

## 2017-08-10 MED ORDER — LISINOPRIL 5 MG PO TABS: 5.0000 mg | ORAL_TABLET | Freq: Every day | ORAL | 3 refills | Status: DC

## 2017-08-10 MED ORDER — ASPIRIN EC 81 MG PO TBEC: 81.0000 mg | DELAYED_RELEASE_TABLET | Freq: Every day | ORAL | 1 refills | Status: AC

## 2017-08-10 MED ORDER — TRUE METRIX METER DEVI: 1.0000 | Freq: Four times a day (QID) | 0 refills | Status: AC

## 2017-08-10 MED ORDER — METFORMIN HCL 500 MG PO TABS: 500.0000 mg | ORAL_TABLET | Freq: Two times a day (BID) | ORAL | 3 refills | Status: DC

## 2017-08-10 MED ORDER — ATORVASTATIN CALCIUM 20 MG PO TABS: 20.0000 mg | ORAL_TABLET | Freq: Every day | ORAL | 3 refills | Status: DC

## 2017-08-10 MED ORDER — GLUCOSE BLOOD VI STRP: ORAL_STRIP | 12 refills | Status: AC

## 2017-08-10 MED FILL — TRUE METRIX TEST STRIP: 25 days supply | Qty: 100 | Fill #0

## 2017-08-10 MED FILL — TRUEplus LANCETS 28G MISC: 25 days supply | Qty: 100 | Fill #0

## 2017-08-10 MED FILL — !TRUE METRIX BLOOD GLUCOSE: 1 days supply | Qty: 1 | Fill #0

## 2017-08-10 NOTE — Patient Instructions (Signed)
Pick up a new blood sugar meter form our pharmacy today Aim for 30 minutes of exercise most days. Rethink what you drink. Water is great! Aim for 2-3 Carb Choices per meal (30-45 grams) +/- 1 either way  Aim for 0-15 Carbs per snack if hungry  Include protein in moderation with your meals and snacks  Consider reading food labels for Total Carbohydrate and Fat Grams of foods  Consider checking BG at alternate times per day  Continue taking medication as directed Be mindful about how much sugar you are adding to beverages and other foods. Fruit Punch - find one with no sugar  Measure and decrease portions of carbohydrate foods  Make your plate and don't go back for seconds
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
## 2017-08-23 ENCOUNTER — Ambulatory Visit: Payer: Self-pay | Attending: Family Medicine

## 2017-08-30 ENCOUNTER — Encounter: Payer: PRIVATE HEALTH INSURANCE | Attending: Physician Assistant | Admitting: *Deleted

## 2017-08-30 DIAGNOSIS — IMO0002 Reserved for concepts with insufficient information to code with codable children: Secondary | ICD-10-CM

## 2017-08-30 DIAGNOSIS — E1165 Type 2 diabetes mellitus with hyperglycemia: Secondary | ICD-10-CM

## 2017-08-30 DIAGNOSIS — Z713 Dietary counseling and surveillance: Secondary | ICD-10-CM | POA: Insufficient documentation

## 2017-08-30 DIAGNOSIS — E118 Type 2 diabetes mellitus with unspecified complications: Secondary | ICD-10-CM

## 2017-08-30 DIAGNOSIS — E119 Type 2 diabetes mellitus without complications: Secondary | ICD-10-CM | POA: Insufficient documentation

## 2017-08-30 NOTE — Progress Notes (Signed)
Diabetes Self-Management Education  Visit Type: First/Initial  Appt. Start Time: 1430 Appt. End Time: 1600  08/30/2017  Ms. Desiree Hernandez, identified by name and date of birth, is a 47 y.o. female with a diagnosis of Diabetes: Type 2. Patient states she was in [place] in February and is still recovering. 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   ASSESSMENT  There were no vitals taken for this visit. There is no height or weight on file to calculate BMI.  Diabetes Self-Management Education - 08/30/17 1619      Initial Visit   Diabetes Type  Type 2    Are you currently following a meal plan?  No    Are you taking your medications as prescribed?  Yes    Date Diagnosed  2016      Health Coping   How would you rate your overall health?  Fair      Psychosocial Assessment   Patient Belief/Attitude about Diabetes  Motivated to manage diabetes    Self-management support  Family    Other persons present  Patient;Parent    Patient Concerns  Nutrition/Meal planning;Glycemic Control;Monitoring    Special Needs  None    Learning Readiness  Contemplating    How often do you need to have someone help you when you read instructions, pamphlets, or other written materials from your doctor or pharmacy?  1 - Never    What is the last grade level you completed in school?  college      Pre-Education Assessment   Patient understands the diabetes disease and treatment process.  Needs Review    Patient understands incorporating nutritional management into lifestyle.  Needs Instruction    Patient undertands incorporating physical activity into lifestyle.  Needs Instruction    Patient understands using medications safely.  Needs Instruction    Patient understands monitoring blood glucose, interpreting and using results  Needs Instruction    Patient understands prevention, detection, and treatment of acute complications.  Needs Instruction    Patient understands prevention,  detection, and treatment of chronic complications.  Needs Instruction    Patient understands how to develop strategies to address psychosocial issues.  Needs Instruction    Patient understands how to develop strategies to promote health/change behavior.  Needs Instruction      Complications   Last HgB A1C per patient/outside source  -- not provided by MD    How often do you check your blood sugar?  1-2 times/day    Fasting Blood glucose range (mg/dL)  >200    Postprandial Blood glucose range (mg/dL)  >200    Have you had a dilated eye exam in the past 12 months?  No    Have you had a dental exam in the past 12 months?  No    Are you checking your feet?  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  6    How many minutes per day do you exercise?  15    Total minutes per week of exercise  90      Patient Education   Previous Diabetes Education  No    Disease state   Definition of diabetes, type 1 and 2, and the diagnosis of diabetes;Factors that contribute to the development of diabetes    Nutrition management  Role of diet in the treatment of diabetes and the relationship between the three main macronutrients and blood glucose level;Carbohydrate counting;Food label reading, portion sizes and measuring food.;Reviewed blood glucose goals for pre and post meals and how to evaluate the patients' food intake on their blood glucose level.    Physical activity and exercise   Role of exercise on diabetes management, blood pressure control and cardiac health.;Helped patient identify appropriate exercises in relation to his/her diabetes, diabetes complications and other health issue.    Medications  Reviewed patients medication for diabetes, action, purpose, timing of dose and side effects.    Monitoring  Purpose and frequency of SMBG.;Identified appropriate SMBG and/or A1C goals.    Chronic complications  Relationship between  chronic complications and blood glucose control    Psychosocial adjustment  Role of stress on diabetes      Individualized Goals (developed by patient)   Nutrition  Follow meal plan discussed    Physical Activity  Exercise 3-5 times per week    Medications  take my medication as prescribed    Monitoring   test blood glucose pre and post meals as discussed      Post-Education Assessment   Patient understands the diabetes disease and treatment process.  Demonstrates understanding / competency    Patient understands incorporating nutritional management into lifestyle.  Demonstrates understanding / competency    Patient undertands incorporating physical activity into lifestyle.  Demonstrates understanding / competency    Patient understands using medications safely.  Demonstrates understanding / competency    Patient understands monitoring blood glucose, interpreting and using results  Demonstrates understanding / competency    Patient understands prevention, detection, and treatment of acute complications.  Demonstrates understanding / competency    Patient understands prevention, detection, and treatment of chronic complications.  Demonstrates understanding / competency    Patient understands how to develop strategies to address psychosocial issues.  Demonstrates understanding / competency    Patient understands how to develop strategies to promote health/change behavior.  Demonstrates understanding / competency      Outcomes   Expected Outcomes  Demonstrated interest in learning. Expect positive outcomes    Future DMSE  PRN    Program Status  Completed      Individualized Plan for Diabetes Self-Management Training:   Learning Objective:  Patient will have a greater understanding of diabetes self-management. Patient education plan is to attend individual and/or group sessions per assessed needs and concerns.   Plan:   Patient Instructions  Plan:  Aim for 3 Carb Choices per meal (45  grams) +/- 1 either way  Aim for 0-2 Carbs per snack if hungry  Include protein in moderation with your meals and snacks Consider reading food labels for Total Carbohydrate of foods Consider  increasing your activity level by trying some Arm Chair Exercises or water exercises as tolerated Consider checking BG at alternate times per day   Continue taking medication as directed by MD  Expected Outcomes:  Demonstrated interest in learning. Expect positive outcomes  Education material provided: Living Well with Diabetes, A1C conversion sheet, Meal plan card and Carbohydrate counting sheet, Arm Chair Exercise, Carb Counting APP handout  If problems or questions, patient to contact team via:  Phone  Future DSME appointment: PRN

## 2017-08-30 NOTE — Patient Instructions (Signed)
Plan:  Aim for 3 Carb Choices per meal (45 grams) +/- 1 either way  Aim for 0-2 Carbs per snack if hungry  Include protein in moderation with your meals and snacks Consider reading food labels for Total Carbohydrate of foods Consider  increasing your activity level by trying some Arm Chair Exercises or water exercises as tolerated Consider checking BG at alternate times per day   Continue taking medication as directed by MD

## 2017-09-01 ENCOUNTER — Encounter: Payer: Self-pay | Admitting: Nurse Practitioner

## 2017-09-01 ENCOUNTER — Ambulatory Visit: Payer: Self-pay | Attending: Nurse Practitioner | Admitting: Nurse Practitioner

## 2017-09-01 VITALS — BP 110/73 | HR 85 | Temp 98.4°F | Ht 63.0 in | Wt 249.2 lb

## 2017-09-01 DIAGNOSIS — Z9851 Tubal ligation status: Secondary | ICD-10-CM | POA: Insufficient documentation

## 2017-09-01 DIAGNOSIS — Z7982 Long term (current) use of aspirin: Secondary | ICD-10-CM | POA: Insufficient documentation

## 2017-09-01 DIAGNOSIS — E1165 Type 2 diabetes mellitus with hyperglycemia: Secondary | ICD-10-CM | POA: Insufficient documentation

## 2017-09-01 DIAGNOSIS — J45909 Unspecified asthma, uncomplicated: Secondary | ICD-10-CM | POA: Insufficient documentation

## 2017-09-01 DIAGNOSIS — E669 Obesity, unspecified: Secondary | ICD-10-CM | POA: Insufficient documentation

## 2017-09-01 DIAGNOSIS — Z880 Allergy status to penicillin: Secondary | ICD-10-CM | POA: Insufficient documentation

## 2017-09-01 DIAGNOSIS — I10 Essential (primary) hypertension: Secondary | ICD-10-CM | POA: Insufficient documentation

## 2017-09-01 DIAGNOSIS — Z9889 Other specified postprocedural states: Secondary | ICD-10-CM | POA: Insufficient documentation

## 2017-09-01 DIAGNOSIS — Z79899 Other long term (current) drug therapy: Secondary | ICD-10-CM | POA: Insufficient documentation

## 2017-09-01 DIAGNOSIS — S99829A Other specified injuries of unspecified foot, initial encounter: Secondary | ICD-10-CM

## 2017-09-01 DIAGNOSIS — Z7984 Long term (current) use of oral hypoglycemic drugs: Secondary | ICD-10-CM | POA: Insufficient documentation

## 2017-09-01 DIAGNOSIS — E785 Hyperlipidemia, unspecified: Secondary | ICD-10-CM | POA: Insufficient documentation

## 2017-09-01 DIAGNOSIS — E119 Type 2 diabetes mellitus without complications: Secondary | ICD-10-CM | POA: Insufficient documentation

## 2017-09-01 DIAGNOSIS — Z9114 Patient's other noncompliance with medication regimen: Secondary | ICD-10-CM | POA: Insufficient documentation

## 2017-09-01 LAB — GLUCOSE, POCT (MANUAL RESULT ENTRY): POC Glucose: 249 mg/dl — AB (ref 70–99)

## 2017-09-01 LAB — POCT GLYCOSYLATED HEMOGLOBIN (HGB A1C): Hemoglobin A1C: 11.2

## 2017-09-01 MED ORDER — DOXYCYCLINE HYCLATE 100 MG PO TABS: 100.0000 mg | ORAL_TABLET | Freq: Two times a day (BID) | ORAL | 0 refills | Status: AC

## 2017-09-01 MED ORDER — GLIMEPIRIDE 4 MG PO TABS: 4.0000 mg | ORAL_TABLET | Freq: Every day | ORAL | 1 refills | Status: DC

## 2017-09-01 MED ORDER — SITAGLIPTIN PHOS-METFORMIN HCL 50-1000 MG PO TABS: 1.0000 | ORAL_TABLET | Freq: Two times a day (BID) | ORAL | 3 refills | Status: DC

## 2017-09-01 MED FILL — DOXYCYCLINE 100 MG TABLET: 100 | 7 days supply | Qty: 14 | Fill #0

## 2017-09-01 MED FILL — JANUMET 50-1,000 MG TABLET: 50-1000 | 30 days supply | Qty: 60 | Fill #0

## 2017-09-01 MED FILL — GLIMEPIRIDE 4 MG TABLET: 4 | 30 days supply | Qty: 30 | Fill #0

## 2017-09-01 NOTE — Patient Instructions (Signed)
Diabetes blood sugar goals  Fasting in AM before breakfast which means at least 8 hrs of no eating or drinking) except water or unsweetened coffee or tea): 90-110 2 hrs after meals: < 160,   Hypoglycemia or low blood sugar: < 70 (You should not have hypoglycemia.)  Aim for 30 minutes of exercise most days. Rethink what you drink. Water is great! Aim for 2-3 Carb Choices per meal (30-45 grams) +/- 1 either way  Aim for 0-15 Carbs per snack if hungry  Include protein in moderation with your meals and snacks  Consider reading food labels for Total Carbohydrate and Fat Grams of foods  Consider checking BG at alternate times per day  Continue taking medication as directed Be mindful about how much sugar you are adding to beverages and other foods. Fruit Punch - find one with no sugar  Measure and decrease portions of carbohydrate foods  Make your plate and don't go back for seconds   Diabetes Mellitus and Nutrition When you have diabetes (diabetes mellitus), it is very important to have healthy eating habits because your blood sugar (glucose) levels are greatly affected by what you eat and drink. Eating healthy foods in the appropriate amounts, at about the same times every day, can help you:  Control your blood glucose.  Lower your risk of heart disease.  Improve your blood pressure.  Reach or maintain a healthy weight.  Every person with diabetes is different, and each person has different needs for a meal plan. Your health care provider may recommend that you work with a diet and nutrition specialist (dietitian) to make a meal plan that is best for you. Your meal plan may vary depending on factors such as:  The calories you need.  The medicines you take.  Your weight.  Your blood glucose, blood pressure, and cholesterol levels.  Your activity level.  Other health conditions you have, such as heart or kidney disease.  How do carbohydrates affect me? Carbohydrates  affect your blood glucose level more than any other type of food. Eating carbohydrates naturally increases the amount of glucose in your blood. Carbohydrate counting is a method for keeping track of how many carbohydrates you eat. Counting carbohydrates is important to keep your blood glucose at a healthy level, especially if you use insulin or take certain oral diabetes medicines. It is important to know how many carbohydrates you can safely have in each meal. This is different for every person. Your dietitian can help you calculate how many carbohydrates you should have at each meal and for snack. Foods that contain carbohydrates include:  Bread, cereal, rice, pasta, and crackers.  Potatoes and corn.  Peas, beans, and lentils.  Milk and yogurt.  Fruit and juice.  Desserts, such as cakes, cookies, ice cream, and candy.  How does alcohol affect me? Alcohol can cause a sudden decrease in blood glucose (hypoglycemia), especially if you use insulin or take certain oral diabetes medicines. Hypoglycemia can be a life-threatening condition. Symptoms of hypoglycemia (sleepiness, dizziness, and confusion) are similar to symptoms of having too much alcohol. If your health care provider says that alcohol is safe for you, follow these guidelines:  Limit alcohol intake to no more than 1 drink per day for nonpregnant women and 2 drinks per day for men. One drink equals 12 oz of beer, 5 oz of wine, or 1 oz of hard liquor.  Do not drink on an empty stomach.  Keep yourself hydrated with water, diet soda, or  unsweetened iced tea.  Keep in mind that regular soda, juice, and other mixers may contain a lot of sugar and must be counted as carbohydrates.  What are tips for following this plan? Reading food labels  Start by checking the serving size on the label. The amount of calories, carbohydrates, fats, and other nutrients listed on the label are based on one serving of the food. Many foods contain more  than one serving per package.  Check the total grams (g) of carbohydrates in one serving. You can calculate the number of servings of carbohydrates in one serving by dividing the total carbohydrates by 15. For example, if a food has 30 g of total carbohydrates, it would be equal to 2 servings of carbohydrates.  Check the number of grams (g) of saturated and trans fats in one serving. Choose foods that have low or no amount of these fats.  Check the number of milligrams (mg) of sodium in one serving. Most people should limit total sodium intake to less than 2,300 mg per day.  Always check the nutrition information of foods labeled as "low-fat" or "nonfat". These foods may be higher in added sugar or refined carbohydrates and should be avoided.  Talk to your dietitian to identify your daily goals for nutrients listed on the label. Shopping  Avoid buying canned, premade, or processed foods. These foods tend to be high in fat, sodium, and added sugar.  Shop around the outside edge of the grocery store. This includes fresh fruits and vegetables, bulk grains, fresh meats, and fresh dairy. Cooking  Use low-heat cooking methods, such as baking, instead of high-heat cooking methods like deep frying.  Cook using healthy oils, such as olive, canola, or sunflower oil.  Avoid cooking with butter, cream, or high-fat meats. Meal planning  Eat meals and snacks regularly, preferably at the same times every day. Avoid going long periods of time without eating.  Eat foods high in fiber, such as fresh fruits, vegetables, beans, and whole grains. Talk to your dietitian about how many servings of carbohydrates you can eat at each meal.  Eat 4-6 ounces of lean protein each day, such as lean meat, chicken, fish, eggs, or tofu. 1 ounce is equal to 1 ounce of meat, chicken, or fish, 1 egg, or 1/4 cup of tofu.  Eat some foods each day that contain healthy fats, such as avocado, nuts, seeds, and  fish. Lifestyle   Check your blood glucose regularly.  Exercise at least 30 minutes 5 or more days each week, or as told by your health care provider.  Take medicines as told by your health care provider.  Do not use any products that contain nicotine or tobacco, such as cigarettes and e-cigarettes. If you need help quitting, ask your health care provider.  Work with a Social worker or diabetes educator to identify strategies to manage stress and any emotional and social challenges. What are some questions to ask my health care provider?  Do I need to meet with a diabetes educator?  Do I need to meet with a dietitian?  What number can I call if I have questions?  When are the best times to check my blood glucose? Where to find more information:  American Diabetes Association: diabetes.org/food-and-fitness/food  Academy of Nutrition and Dietetics: PokerClues.dk  [employer] of Diabetes and Digestive and Kidney Diseases (NIH): ContactWire.be Summary  A healthy meal plan will help you control your blood glucose and maintain a healthy lifestyle.  Working with a  diet and nutrition specialist (dietitian) can help you make a meal plan that is best for you.  Keep in mind that carbohydrates and alcohol have immediate effects on your blood glucose levels. It is important to count carbohydrates and to use alcohol carefully. This information is not intended to replace advice given to you by your health care provider. Make sure you discuss any questions you have with your health care provider. Document Released: 02/10/2005 Document Revised: 06/20/2016 Document Reviewed: 06/20/2016 Elsevier Interactive Patient Education  [employer].
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
## 2017-09-02 LAB — CMP14+EGFR
ALT: 14 IU/L (ref 0–32)
AST: 9 IU/L (ref 0–40)
Albumin/Globulin Ratio: 0.7 — ABNORMAL LOW (ref 1.2–2.2)
Albumin: 3.9 g/dL (ref 3.5–5.5)
Alkaline Phosphatase: 47 IU/L (ref 39–117)
BUN/Creatinine Ratio: 13 (ref 9–23)
BUN: 11 mg/dL (ref 6–24)
Bilirubin Total: <0.2 mg/dL (ref 0.0–1.2)
CALCIUM: 9.5 mg/dL (ref 8.7–10.2)
CO2: 25 mmol/L (ref 20–29)
CREATININE: 0.87 mg/dL (ref 0.57–1.00)
Chloride: 93 mmol/L — ABNORMAL LOW (ref 96–106)
GFR calc Af Amer: 93 mL/min/{1.73_m2} (ref >59)
GFR calc non Af Amer: 81 mL/min/{1.73_m2} (ref >59)
GLOBULIN, TOTAL: 5.7 g/dL — AB (ref 1.5–4.5)
GLUCOSE: 199 mg/dL — AB (ref 65–99)
Potassium: 4.4 mmol/L (ref 3.5–5.2)
Sodium: 132 mmol/L — ABNORMAL LOW (ref 134–144)
Total Protein: 9.6 g/dL — ABNORMAL HIGH (ref 6.0–8.5)

## 2017-09-19 ENCOUNTER — Telehealth: Payer: Self-pay | Admitting: Nurse Practitioner

## 2017-09-19 ENCOUNTER — Encounter: Payer: Self-pay | Admitting: Nurse Practitioner

## 2017-09-19 NOTE — Telephone Encounter (Signed)
4 page, paperwork recieved through fax 09-19-17.

## 2017-09-29 MED FILL — GLIMEPIRIDE 4 MG TABS: 4 | 30 days supply | Qty: 30 | Fill #1

## 2017-09-29 MED FILL — TRUEplus LANCETS 28G MISC: 25 days supply | Qty: 100 | Fill #1

## 2017-09-29 MED FILL — !JANUMET 50-1000MG TABLET: 50-1000 | 30 days supply | Qty: 60 | Fill #1

## 2017-09-29 MED FILL — TRUE METRIX TEST STRIP: 25 days supply | Qty: 100 | Fill #1

## 2017-10-04 ENCOUNTER — Ambulatory Visit: Payer: Self-pay | Attending: Nurse Practitioner | Admitting: Physician Assistant

## 2017-10-04 VITALS — BP 111/82 | HR 83 | Temp 98.8°F | Resp 16 | Ht 63.0 in | Wt 244.4 lb

## 2017-10-04 DIAGNOSIS — Z7982 Long term (current) use of aspirin: Secondary | ICD-10-CM | POA: Insufficient documentation

## 2017-10-04 DIAGNOSIS — Z6841 Body Mass Index (BMI) 40.0 and over, adult: Secondary | ICD-10-CM | POA: Insufficient documentation

## 2017-10-04 DIAGNOSIS — F32A Depression, unspecified: Secondary | ICD-10-CM | POA: Insufficient documentation

## 2017-10-04 DIAGNOSIS — F329 Major depressive disorder, single episode, unspecified: Secondary | ICD-10-CM | POA: Insufficient documentation

## 2017-10-04 DIAGNOSIS — I10 Essential (primary) hypertension: Secondary | ICD-10-CM | POA: Insufficient documentation

## 2017-10-04 DIAGNOSIS — Z7984 Long term (current) use of oral hypoglycemic drugs: Secondary | ICD-10-CM | POA: Insufficient documentation

## 2017-10-04 DIAGNOSIS — Z79899 Other long term (current) drug therapy: Secondary | ICD-10-CM | POA: Insufficient documentation

## 2017-10-04 DIAGNOSIS — Z880 Allergy status to penicillin: Secondary | ICD-10-CM | POA: Insufficient documentation

## 2017-10-04 DIAGNOSIS — E785 Hyperlipidemia, unspecified: Secondary | ICD-10-CM | POA: Insufficient documentation

## 2017-10-04 DIAGNOSIS — S39012A Strain of muscle, fascia and tendon of lower back, initial encounter: Secondary | ICD-10-CM

## 2017-10-04 DIAGNOSIS — E1165 Type 2 diabetes mellitus with hyperglycemia: Secondary | ICD-10-CM | POA: Insufficient documentation

## 2017-10-04 DIAGNOSIS — X58XXXA Exposure to other specified factors, initial encounter: Secondary | ICD-10-CM | POA: Insufficient documentation

## 2017-10-04 DIAGNOSIS — S29012A Strain of muscle and tendon of back wall of thorax, initial encounter: Secondary | ICD-10-CM | POA: Insufficient documentation

## 2017-10-04 DIAGNOSIS — E669 Obesity, unspecified: Secondary | ICD-10-CM | POA: Insufficient documentation

## 2017-10-04 DIAGNOSIS — M1990 Unspecified osteoarthritis, unspecified site: Secondary | ICD-10-CM | POA: Insufficient documentation

## 2017-10-04 LAB — GLUCOSE, POCT (MANUAL RESULT ENTRY): POC GLUCOSE: 97 mg/dL (ref 70–99)

## 2017-10-04 MED ORDER — NAPROXEN 500 MG PO TABS: 500.0000 mg | ORAL_TABLET | Freq: Two times a day (BID) | ORAL | 0 refills | Status: DC

## 2017-10-04 MED ORDER — METHOCARBAMOL 500 MG PO TABS: 500.0000 mg | ORAL_TABLET | Freq: Three times a day (TID) | ORAL | 0 refills | Status: DC

## 2017-10-04 MED FILL — NAPROXEN 500 MG TABLET: 500 | 30 days supply | Qty: 60 | Fill #0

## 2017-10-04 MED FILL — METHOCARBAMOL 500 MG TABS: 500 | 30 days supply | Qty: 90 | Fill #0

## 2017-10-04 NOTE — Progress Notes (Signed)
Pt. Is here for back pain. Pt. Stated it hurts when she turn over.

## 2017-10-04 NOTE — Progress Notes (Signed)
Patient ID: Desiree Hernandez, female   DOB: [birth year], 47 y.o.   MRN: [id number]   Desiree Hernandez, is a 47 y.o. female  CWC:[id number]  [id number]  DOB - [birth year]  Subjective:  Chief Complaint and HPI: Desiree Hernandez is a 47 y.o. female here today for thoracic back strain.  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  Pain prevented her from being able to work since Monday.  NKI.  No numbness/weakness.  Pain is in thoracolumbar region and worse on L.  Not taking anything for pain.  Movement worsens pain.  Blood sugars under 150  PHQ-9 #9=1.  Had long discussion with patient.  Patient says this is long-standing and ongoing.  Has thoughts of "not wanting to be here," but no active plan of harming herself.  No plan/intent.  Mom is here with her and supportive.  Mental illness and depression "run rampant" in family.  This is "nothing new."  She declines meeting with the Education officer, museum.  There are no acute safety issues identified today. No weapons in the home.     ROS:   Constitutional:  No f/c, No night sweats, No unexplained weight loss. EENT:  No vision changes, No blurry vision, No hearing changes. No mouth, throat, or ear problems.  Respiratory: No cough, No SOB Cardiac: No CP, no palpitations GI:  No abd pain, No N/V/D. GU: No Urinary s/sx Musculoskeletal: + back pain Neuro: No headache, no dizziness, no motor weakness.  Skin: No rash Endocrine:  No polydipsia. No polyuria.  Psych: Denies SI/HI  Problem  Depression    ALLERGIES: Allergies  Allergen Reactions  . Penicillins Rash    Childhood allergy Has patient had a PCN reaction causing immediate rash, facial/tongue/throat swelling, SOB or lightheadedness with hypotension: Yes Has patient had a PCN reaction causing severe rash involving mucus membranes or skin necrosis: No Has patient had a PCN reaction that required hospitalization No Has patient had a PCN reaction occurring within the last 10 years: No If all of the above answers  are "NO", then may proceed with Cephalosporin use.     PAST MEDICAL HISTORY: Past Medical History:  Diagnosis Date  . Arthritis   . Asthma   . Diabetes mellitus without complication ([place])   . Hyperlipidemia   . Hypertension   . Obesity     MEDICATIONS AT HOME: Prior to Admission medications   Medication Sig Start Date End Date Taking? Authorizing Provider  aspirin EC 81 MG tablet Take 1 tablet (81 mg total) by mouth daily. 08/10/17  Yes Ena Dawley, Tiffany S, PA-C  atorvastatin (LIPITOR) 20 MG tablet Take 1 tablet (20 mg total) by mouth daily. 08/10/17  Yes Ena Dawley, Tiffany S, PA-C  Blood Glucose Monitoring Suppl (TRUE METRIX METER) DEVI 1 kit by Does not apply route 4 (four) times daily. 08/10/17  Yes Ena Dawley, Tiffany S, PA-C  glimepiride (AMARYL) 4 MG tablet Take 1 tablet (4 mg total) by mouth daily before breakfast. 09/01/17  Yes [name], NP  glucose blood (TRUE METRIX BLOOD GLUCOSE TEST) test strip Use as instructed 08/10/17  Yes Ena Dawley, Tiffany S, PA-C  lisinopril (PRINIVIL,ZESTRIL) 5 MG tablet Take 1 tablet (5 mg total) by mouth daily. 08/10/17  Yes Ena Dawley, Tiffany S, PA-C  sitaGLIPtin-metformin (JANUMET) 50-1000 MG tablet Take 1 tablet by mouth 2 (two) times daily with a meal. 09/01/17  Yes [name], NP  TRUEPLUS LANCETS 28G MISC 28 g by Does not apply route 4 (four) times daily. 08/10/17  Yes Zettie Pho  S, PA-C  methocarbamol (ROBAXIN) 500 MG tablet Take 1 tablet (500 mg total) by mouth 3 (three) times daily. X 1 week then prn muscle spasm 10/04/17   Freeman Caldron M, PA-C  naproxen (NAPROSYN) 500 MG tablet Take 1 tablet (500 mg total) by mouth 2 (two) times daily with a meal. X 7 days then prn pain 10/04/17   Argentina Donovan, PA-C     Objective:  EXAM:   Vitals:   10/04/17 0959  BP: 111/82  Pulse: 83  Resp: 16  Temp: 98.8 F (37.1 C)  TempSrc: Oral  SpO2: 96%  Weight: 244 lb 6.4 oz (110.9 kg)  Height: 5' 3"  (1.6 m)    General appearance : A&OX3. NAD.  Non-toxic-appearing; obese HEENT: Atraumatic and Normocephalic.  PERRLA. EOM intact.  Neck: supple, no JVD. No cervical lymphadenopathy. No thyromegaly Chest/Lungs:  Breathing-non-labored, Good air entry bilaterally, breath sounds normal without rales, rhonchi, or wheezing  CVS: S1 S2 regular, no murmurs, gallops, rubs  Back: paraspinus TTP thoracic and lumbar region.  No bony TTP.  Unable to illicit Reflexes B even with augmentation(she says her reflexes have always been absent), neg SLR B Extremities: Bilateral Lower Ext shows no edema, both legs are warm to touch with = pulse throughout Neurology:  CN II-XII grossly intact, Non focal.   Psych:  TP linear. J/I WNL. Normal speech. Appropriate eye contact and affect.  Skin:  No Rash  Data Review Lab Results  Component Value Date   HGBA1C 11.2 09/01/2017     Assessment & Plan   1. Back strain, initial encounter No red flags-believe musculoskeletal  - naproxen (NAPROSYN) 500 MG tablet; Take 1 tablet (500 mg total) by mouth 2 (two) times daily with a meal. X 7 days then prn pain  Dispense: 60 tablet; Refill: 0 - methocarbamol (ROBAXIN) 500 MG tablet; Take 1 tablet (500 mg total) by mouth 3 (three) times daily. X 1 week then prn muscle spasm  Dispense: 90 tablet; Refill: 0  2. Depression, unspecified depression type Stable. Patient says this is long-standing and ongoing.  Has thoughts of "not wanting to be here," but no active plan of harming herself.  No plan/intent.  Mom is here with her and supportive.  Mental illness and depression "run rampant" in family.  This is "nothing new."  She declines meeting with the Education officer, museum.  There are no acute safety issues identified today.  Spent >88mns face to face.    3. Type 2 diabetes mellitus with hyperglycemia, without long-term current use of insulin ([place] Blood sugar is good today - Glucose (CBG)   Patient have been counseled extensively about nutrition and exercise  Return for keep  12/04/2017 appt with ZGeryl Rankins  The patient was given clear instructions to go to ER or return to medical center if symptoms don't improve, worsen or new problems develop. The patient verbalized understanding. The patient was told to call to get lab results if they haven't heard anything in the next week.     AFreeman Caldron PA-C [facility] [place] [facility] [place]  10/04/2017, 10:20 AM

## 2017-10-30 MED FILL — $JANUMET 50-1000 MG TABLET: 50-1000 | 30 days supply | Qty: 60 | Fill #2

## 2017-10-30 MED FILL — ?GLIMEPIRIDE 4 MG TABLET: 4 | 30 days supply | Qty: 30 | Fill #2

## 2017-11-06 ENCOUNTER — Encounter: Payer: Self-pay | Admitting: Nurse Practitioner

## 2017-11-06 ENCOUNTER — Ambulatory Visit: Payer: Self-pay | Attending: Nurse Practitioner | Admitting: Physician Assistant

## 2017-11-06 ENCOUNTER — Telehealth: Payer: Self-pay | Admitting: Nurse Practitioner

## 2017-11-06 VITALS — BP 114/81 | HR 91 | Temp 98.7°F | Resp 16 | Wt 242.0 lb

## 2017-11-06 DIAGNOSIS — Z7984 Long term (current) use of oral hypoglycemic drugs: Secondary | ICD-10-CM | POA: Insufficient documentation

## 2017-11-06 DIAGNOSIS — M545 Low back pain, unspecified: Secondary | ICD-10-CM

## 2017-11-06 DIAGNOSIS — Z7982 Long term (current) use of aspirin: Secondary | ICD-10-CM | POA: Insufficient documentation

## 2017-11-06 DIAGNOSIS — Z79899 Other long term (current) drug therapy: Secondary | ICD-10-CM | POA: Insufficient documentation

## 2017-11-06 MED ORDER — PREDNISONE 20 MG PO TABS: 20.0000 mg | ORAL_TABLET | Freq: Every day | ORAL | 0 refills | Status: DC

## 2017-11-06 MED FILL — predniSONE 20 MG TABS: 20 | 5 days supply | Qty: 5 | Fill #0

## 2017-11-06 NOTE — Progress Notes (Signed)
HFU: back RF: BP and Cholesterol

## 2017-11-06 NOTE — Telephone Encounter (Signed)
Patient stopped by the front and requested for listed medications to be refilled at  atorvastatin (LIPITOR) 20 MG tablet [[id number]]  lisinopril (PRINIVIL,ZESTRIL) 5 MG tablet [[id number]]  [facility].

## 2017-11-06 NOTE — Progress Notes (Addendum)
Chief Complaint: Back pain  Subjective: Desiree Hernandez comes in today for an acute visit for back pain.  She was seen last month for back pain that was felt to be musculoskeletal but she states that this is different.  She states that one week ago she started experiencing bilateral low back pain.  She describes doing minor chores at home and then suddenly feeling a shooting pain in her bilateral lower back.  She could not move.  She could not walk.  Her legs felt a little numb in the thigh region on the anterior aspect.  She did take her muscle relaxers and anti-inflammatories with temporary relief.  She states that she went to a local emergency department for further evaluation.  I do not have these records. States that she had an xray that showed mild spurring. She states that she was prescribed pain patches with continued temporary relief.  She has been unable to work.   ROS:  GEN: denies fever or chills, denies change in weight Skin: denies lesions or rashes EXT: denies muscle spasms or swelling; no pain in lower ext, + weakness  NEURO: + numbness no tingling, denies sz, stroke or TIA   Objective:  Vitals:   11/06/17 0844  BP: 114/81  Pulse: 91  Resp: 16  Temp: 98.7 F (37.1 C)  TempSrc: Oral  SpO2: 97%  Weight: 242 lb (109.8 kg)    Physical Exam:  General: in no acute distress. HEENT: no pallor, no icterus, moist oral mucosa, no JVD, no lymphadenopathy Extremities:ROM is actually decent, nontender to palp Neuro: Alert, awake, oriented x3, nonfocal.  Medications: Prior to Admission medications   Medication Sig Start Date End Date Taking? Authorizing Provider  aspirin EC 81 MG tablet Take 1 tablet (81 mg total) by mouth daily. 08/10/17  Yes Ena Dawley, Searra Carnathan S, PA-C  atorvastatin (LIPITOR) 20 MG tablet Take 1 tablet (20 mg total) by mouth daily. 08/10/17  Yes Ena Dawley, Kamdyn Colborn S, PA-C  Blood Glucose Monitoring Suppl (TRUE METRIX METER) DEVI 1 kit by Does not apply route 4 (four) times  daily. 08/10/17  Yes Ena Dawley, Archit Leger S, PA-C  glimepiride (AMARYL) 4 MG tablet Take 1 tablet (4 mg total) by mouth daily before breakfast. 09/01/17  Yes [name], NP  glucose blood (TRUE METRIX BLOOD GLUCOSE TEST) test strip Use as instructed 08/10/17  Yes Ena Dawley, Reuven Braver S, PA-C  lisinopril (PRINIVIL,ZESTRIL) 5 MG tablet Take 1 tablet (5 mg total) by mouth daily. 08/10/17  Yes Ena Dawley, Sindhu Nguyen S, PA-C  methocarbamol (ROBAXIN) 500 MG tablet Take 1 tablet (500 mg total) by mouth 3 (three) times daily. X 1 week then prn muscle spasm 10/04/17  Yes McClung, Angela M, PA-C  naproxen (NAPROSYN) 500 MG tablet Take 1 tablet (500 mg total) by mouth 2 (two) times daily with a meal. X 7 days then prn pain 10/04/17  Yes McClung, Angela M, PA-C  sitaGLIPtin-metformin (JANUMET) 50-1000 MG tablet Take 1 tablet by mouth 2 (two) times daily with a meal. 09/01/17  Yes [name], NP  TRUEPLUS LANCETS 28G MISC 28 g by Does not apply route 4 (four) times daily. 08/10/17  Yes Ena Dawley, Laporshia Hogen S, PA-C  predniSONE (DELTASONE) 20 MG tablet Take 1 tablet (20 mg total) by mouth daily with breakfast. 11/06/17   Brayton Caves, PA-C    Assessment: Acute Bilateral Low Back Pain  Plan: Cont Naprosyn/Robaxin/lido patches Add Steroids Offered PT but she declines due to lack of insurance States she has some stretches that  she can continue Note for work  Follow up:4 weeks  The patient was given clear instructions to go to ER or return to medical center if symptoms don't improve, worsen or new problems develop. The patient verbalized understanding. The patient was told to call to get lab results if they haven't heard anything in the next week.   This note has been created with Surveyor, quantity. Any transcriptional errors are unintentional.   Desiree Pho, PA-C 11/06/2017, 9:01 AM

## 2017-11-08 ENCOUNTER — Other Ambulatory Visit: Payer: Self-pay | Admitting: Nurse Practitioner

## 2017-11-08 NOTE — Telephone Encounter (Signed)
Patient already has 6 months of refills available.

## 2017-11-09 MED FILL — ?ATORVASTATIN 20 MG TABLET: 20 | 30 days supply | Qty: 30 | Fill #0

## 2017-11-09 MED FILL — LISINOPRIL 5 MG TAB: 5 | 30 days supply | Qty: 30 | Fill #0

## 2017-11-27 MED FILL — ?GLIMEPIRIDE 4 MG TABLET: 4 | 30 days supply | Qty: 30 | Fill #3

## 2017-11-27 MED FILL — $JANUMET 50-1000 MG TABLET: 50-1000 | 30 days supply | Qty: 60 | Fill #3

## 2017-12-04 ENCOUNTER — Ambulatory Visit: Payer: No Typology Code available for payment source | Admitting: Nurse Practitioner

## 2017-12-11 MED FILL — TRUE METRIX TEST STRIP: 25 days supply | Qty: 100 | Fill #2

## 2017-12-11 MED FILL — ?ATORVASTATIN 20 MG TABLET: 20 | 30 days supply | Qty: 30 | Fill #1

## 2017-12-11 MED FILL — TRUEplus LANCETS 28G MISC: 25 days supply | Qty: 100 | Fill #2

## 2017-12-11 MED FILL — LISINOPRIL 5 MG TAB: 5 | 30 days supply | Qty: 30 | Fill #1

## 2017-12-13 ENCOUNTER — Ambulatory Visit: Payer: Self-pay | Attending: Nurse Practitioner | Admitting: Nurse Practitioner

## 2017-12-13 ENCOUNTER — Ambulatory Visit (HOSPITAL_COMMUNITY)
Admission: RE | Admit: 2017-12-13 | Discharge: 2017-12-13 | Disposition: A | Discharge status: Home / Self Care | Payer: Medicaid Other | Source: Ambulatory Visit | Attending: Nurse Practitioner | Admitting: Nurse Practitioner

## 2017-12-13 ENCOUNTER — Encounter: Payer: Self-pay | Admitting: Nurse Practitioner

## 2017-12-13 VITALS — BP 118/78 | HR 88 | Temp 98.6°F | Ht 63.0 in | Wt 247.0 lb

## 2017-12-13 DIAGNOSIS — M4856XA Collapsed vertebra, not elsewhere classified, lumbar region, initial encounter for fracture: Secondary | ICD-10-CM | POA: Insufficient documentation

## 2017-12-13 DIAGNOSIS — I10 Essential (primary) hypertension: Secondary | ICD-10-CM

## 2017-12-13 DIAGNOSIS — M5144 Schmorl's nodes, thoracic region: Secondary | ICD-10-CM | POA: Insufficient documentation

## 2017-12-13 DIAGNOSIS — M5136 Other intervertebral disc degeneration, lumbar region: Secondary | ICD-10-CM | POA: Insufficient documentation

## 2017-12-13 DIAGNOSIS — Z7982 Long term (current) use of aspirin: Secondary | ICD-10-CM | POA: Insufficient documentation

## 2017-12-13 DIAGNOSIS — Z880 Allergy status to penicillin: Secondary | ICD-10-CM | POA: Insufficient documentation

## 2017-12-13 DIAGNOSIS — M545 Low back pain, unspecified: Secondary | ICD-10-CM

## 2017-12-13 DIAGNOSIS — Z79899 Other long term (current) drug therapy: Secondary | ICD-10-CM | POA: Insufficient documentation

## 2017-12-13 DIAGNOSIS — G8929 Other chronic pain: Secondary | ICD-10-CM | POA: Insufficient documentation

## 2017-12-13 DIAGNOSIS — Z7984 Long term (current) use of oral hypoglycemic drugs: Secondary | ICD-10-CM | POA: Insufficient documentation

## 2017-12-13 DIAGNOSIS — E1165 Type 2 diabetes mellitus with hyperglycemia: Secondary | ICD-10-CM

## 2017-12-13 DIAGNOSIS — J45909 Unspecified asthma, uncomplicated: Secondary | ICD-10-CM | POA: Insufficient documentation

## 2017-12-13 DIAGNOSIS — E785 Hyperlipidemia, unspecified: Secondary | ICD-10-CM | POA: Insufficient documentation

## 2017-12-13 DIAGNOSIS — R200 Anesthesia of skin: Secondary | ICD-10-CM | POA: Insufficient documentation

## 2017-12-13 DIAGNOSIS — E7800 Pure hypercholesterolemia, unspecified: Secondary | ICD-10-CM | POA: Insufficient documentation

## 2017-12-13 DIAGNOSIS — E669 Obesity, unspecified: Secondary | ICD-10-CM | POA: Insufficient documentation

## 2017-12-13 LAB — GLUCOSE, POCT (MANUAL RESULT ENTRY): POC Glucose: 60 mg/dl — AB (ref 70–99)

## 2017-12-13 MED ORDER — METHOCARBAMOL 750 MG PO TABS: 750.0000 mg | ORAL_TABLET | Freq: Three times a day (TID) | ORAL | 1 refills | Status: DC

## 2017-12-13 MED ORDER — NAPROXEN 500 MG PO TABS: 500.0000 mg | ORAL_TABLET | Freq: Two times a day (BID) | ORAL | 0 refills | Status: DC

## 2017-12-13 MED FILL — METHOCARBAMOL 750 MG TABS: 750 | 20 days supply | Qty: 60 | Fill #0

## 2017-12-13 MED FILL — NAPROXEN 500 MG TABLET: 500 | 30 days supply | Qty: 60 | Fill #0

## 2017-12-13 NOTE — Progress Notes (Signed)
Assessment & Plan:  Truth was seen today for follow-up and back pain.  Diagnoses and all orders for this visit:  Acute bilateral low back pain without sciatica -     DG Lumbar Spine Complete; Future -     naproxen (NAPROSYN) 500 MG tablet; Take 1 tablet (500 mg total) by mouth 2 (two) times daily with a meal. X 7 days then prn pain -     methocarbamol (ROBAXIN) 750 MG tablet; Take 1 tablet (750 mg total) by mouth 3 (three) times daily. X 1 week then prn muscle spasm -     Ambulatory referral to Orthopedic Surgery Work on losing weight to help reduce back pain. May alternate with heat and ice application for pain relief. May also alternate with acetaminophen as prescribed for back pain. Other alternatives include massage, acupuncture and water aerobics.  You must stay active and avoid a sedentary lifestyle.   Type 2 diabetes mellitus with hyperglycemia, without long-term current use of insulin (HCC) -     Glucose (CBG) -     Hemoglobin A1c -     Lipid panel -     Ambulatory referral to Ophthalmology Continue blood sugar control as discussed in office today, low carbohydrate diet, and regular physical exercise as tolerated, 150 minutes per week (30 min each day, 5 days per week, or 50 min 3 days per week). Keep blood sugar logs with fasting goal of 90-130 mg/dl, post prandial (after you eat) less than 180.  For Hypoglycemia: BS <60 and Hyperglycemia BS >400; contact the clinic ASAP. Annual eye exams and foot exams are recommended.  Essential hypertension -     CBC -     CMP14+EGFR Continue all antihypertensives as prescribed.  Remember to bring in your blood pressure log with you for your follow up appointment.  DASH/Mediterranean Diets are healthier choices for HTN.     Patient has been counseled on age-appropriate routine health concerns for screening and prevention. These are reviewed and up-to-date. Referrals have been placed accordingly. Immunizations are up-to-date or  declined.    Subjective:   Chief Complaint  Patient presents with  . Follow-up    Pt. is here to follow-up on diabtes, hypertension, and cholesterol.   . Back Pain    Pt. stated she is having a bad back pain.    HPI Desiree Hernandez 47 y.o. female presents to office today for follow up to acute low back pain as well as follow up to HTN and DM.    Back Pain Back pain has been ongoing however she was seen in the office on 11-06-2017 and at that time was endorsing sciatica which caused her to have a temporary inability to walk for a half a day. She reportedly had an xray performed at an emergency room prior to that office visit which showed spurring (mild). She does not have any records with her for the xray and states it was performed at [facility]. She has been prescribed lidocaine patches, prednisone, robaxin, and naproxen in the past for her back pain.  Today she reports: She has been taking 2 aleve since she ran out of her prescriptions, she has been using a heating pad and taking her daughter's robaxin 746m with some relief of her back pain.  She endorses numbness in her legs and feet with prolonged standing. She denies any loss of bowel and bladder.  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  Hypertension She is not exercising and is not adherent to low salt diet.  She does not have a blood pressure log today. She is medication compliant with taking lisinopril 5mg. Blood pressure is well controlled at home. Cardiac symptoms none. Patient denies chest pain, chest pressure/discomfort, exertional chest pressure/discomfort, orthopnea, paroxysmal nocturnal dyspnea and syncope.  Cardiovascular risk factors: diabetes mellitus, hypertension and obesity (BMI >= 30 kg/m2). Use of agents associated with hypertension: NSAIDS. History of target organ damage: none. BP Readings from Last 3 Encounters:  12/13/17 118/78  11/06/17 114/81  10/04/17 111/82     Diabetes Mellitus Type II Current symptoms/problems include none  Known diabetic complications: none Cardiovascular risk factors: diabetes mellitus, hypertension, obesity (BMI >= 30 kg/m2) and sedentary lifestyle Current diabetic medications include: janumet 50-1000 BID mg; glimepiride 4mg  Eye exam current (within one year): no Weight trend: stable Prior visit with dietician: no Current monitoring regimen: home blood tests - infrequently Home blood sugar records: fasting range: 100s and postprandial range: not checking after she eats Any episodes of hypoglycemia? no Is She on ACE inhibitor or angiotensin II receptor blocker?  Yes  Lab Results  Component Value Date   HGBA1C 11.2 09/01/2017    Review of Systems  Constitutional: Negative for fever, malaise/fatigue and weight loss.  HENT: Negative.  Negative for nosebleeds.   Eyes: Negative.  Negative for blurred vision, double vision and photophobia.  Respiratory: Negative.  Negative for cough and shortness of breath.   Cardiovascular: Negative.  Negative for chest pain, palpitations and leg swelling.  Gastrointestinal: Negative.  Negative for heartburn, nausea and vomiting.  Musculoskeletal: Positive for back pain, joint pain and myalgias. Negative for falls.  Neurological: Negative.  Negative for dizziness, focal weakness, seizures and headaches.  Psychiatric/Behavioral: Positive for depression. Negative for suicidal ideas.    Past Medical History:  Diagnosis Date  . Arthritis   . Asthma   . Diabetes mellitus without complication (HCC)   . Hyperlipidemia   . Hypertension   . Obesity     Past Surgical History:  Procedure Laterality Date  . ANAL FISSURE REPAIR    . TUBAL LIGATION      Family History  Problem Relation Age of Onset  . Heart disease Mother   . Heart disease Father   . Alzheimer's disease Father   . Diabetes Father   . Diabetes Sister   . Diabetes Paternal Aunt   . Diabetes Paternal Uncle      Social History Reviewed with no changes to be made today.   Outpatient Medications Prior to Visit  Medication Sig Dispense Refill  . aspirin EC 81 MG tablet Take 1 tablet (81 mg total) by mouth daily. 150 tablet 1  . atorvastatin (LIPITOR) 20 MG tablet Take 1 tablet (20 mg total) by mouth daily. 90 tablet 3  . Blood Glucose Monitoring Suppl (TRUE METRIX METER) DEVI 1 kit by Does not apply route 4 (four) times daily. 1 Device 0  . glimepiride (AMARYL) 4 MG tablet Take 1 tablet (4 mg total) by mouth daily before breakfast. 90 tablet 1  . glucose blood (TRUE METRIX BLOOD GLUCOSE TEST) test strip Use as instructed 100 each 12  . lisinopril (PRINIVIL,ZESTRIL) 5 MG tablet Take 1 tablet (5 mg total) by mouth daily. 90 tablet 3  . sitaGLIPtin-metformin (JANUMET) 50-1000 MG tablet Take 1 tablet by mouth 2 (two) times daily with a meal. 60 tablet 3  . TRUEPLUS LANCETS 28G MISC 28 g by Does not apply route   4 (four) times daily. 120 each 3  . methocarbamol (ROBAXIN) 500 MG tablet Take 1 tablet (500 mg total) by mouth 3 (three) times daily. X 1 week then prn muscle spasm (Patient not taking: Reported on 12/13/2017) 90 tablet 0  . naproxen (NAPROSYN) 500 MG tablet Take 1 tablet (500 mg total) by mouth 2 (two) times daily with a meal. X 7 days then prn pain (Patient not taking: Reported on 12/13/2017) 60 tablet 0  . predniSONE (DELTASONE) 20 MG tablet Take 1 tablet (20 mg total) by mouth daily with breakfast. (Patient not taking: Reported on 12/13/2017) 5 tablet 0   No facility-administered medications prior to visit.     Allergies  Allergen Reactions  . Penicillins Rash    Childhood allergy Has patient had a PCN reaction causing immediate rash, facial/tongue/throat swelling, SOB or lightheadedness with hypotension: Yes Has patient had a PCN reaction causing severe rash involving mucus membranes or skin necrosis: No Has patient had a PCN reaction that required hospitalization No Has patient had a PCN  reaction occurring within the last 10 years: No If all of the above answers are "NO", then may proceed with Cephalosporin use.        Objective:    BP 118/78 (BP Location: Left Arm, Patient Position: Sitting, Cuff Size: Large)   Pulse 88   Temp 98.6 F (37 C) (Oral)   Ht 5' 3" (1.6 m)   Wt 247 lb (112 kg)   SpO2 97%   BMI 43.75 kg/m  Wt Readings from Last 3 Encounters:  12/13/17 247 lb (112 kg)  11/06/17 242 lb (109.8 kg)  10/04/17 244 lb 6.4 oz (110.9 kg)    Physical Exam  Constitutional: She is oriented to person, place, and time. She appears well-developed and well-nourished. She is cooperative.  HENT:  Head: Normocephalic and atraumatic.  Eyes: EOM are normal.  Neck: Normal range of motion.  Cardiovascular: Normal rate, regular rhythm and normal heart sounds. Exam reveals no gallop and no friction rub.  No murmur heard. Pulmonary/Chest: Effort normal and breath sounds normal. No tachypnea. No respiratory distress. She has no decreased breath sounds. She has no wheezes. She has no rhonchi. She has no rales. She exhibits no tenderness.  Abdominal: Bowel sounds are normal.  Musculoskeletal: Normal range of motion. She exhibits no edema.  Neurological: She is alert and oriented to person, place, and time. Coordination normal.  Skin: Skin is warm and dry.  Psychiatric: She has a normal mood and affect. Her behavior is normal. Judgment and thought content normal.  Nursing note and vitals reviewed.      Patient has been counseled extensively about nutrition and exercise as well as the importance of adherence with medications and regular follow-up. The patient was given clear instructions to go to ER or return to medical center if symptoms don't improve, worsen or new problems develop. The patient verbalized understanding.   Follow-up: Return in about 1 month (around 01/10/2018) for 4 weeks for back pain and schedule 3 months for diabetes.   [name], FNP-BC [facility] and [place], [place]   12/13/2017, 12:21 PM

## 2017-12-13 NOTE — Patient Instructions (Signed)
Sciatica Sciatica is pain, numbness, weakness, or tingling along the path of the sciatic nerve. The sciatic nerve starts in the lower back and runs down the back of each leg. The nerve controls the muscles in the lower leg and in the back of the knee. It also provides feeling (sensation) to the back of the thigh, the lower leg, and the sole of the foot. Sciatica is a symptom of another medical condition that pinches or puts pressure on the sciatic nerve. Generally, sciatica only affects one side of the body. Sciatica usually goes away on its own or with treatment. In some cases, sciatica may keep coming back (recur). What are the causes? This condition is caused by pressure on the sciatic nerve, or pinching of the sciatic nerve. This may be the result of:  A disk in between the bones of the spine (vertebrae) bulging out too far (herniated disk).  Age-related changes in the spinal disks (degenerative disk disease).  A pain disorder that affects a muscle in the buttock (piriformis syndrome).  Extra bone growth (bone spur) near the sciatic nerve.  An injury or break (fracture) of the pelvis.  Pregnancy.  Tumor (rare). What increases the risk? The following factors may make you more likely to develop this condition:  Playing sports that place pressure or stress on the spine, such as football or weight lifting.  Having poor strength and flexibility.  A history of back injury.  A history of back surgery.  Sitting for long periods of time.  Doing activities that involve repetitive bending or lifting.  Obesity. What are the signs or symptoms? Symptoms can vary from mild to very severe, and they may include:  Any of these problems in the lower back, leg, hip, or buttock:  Mild tingling or dull aches.  Burning sensations.  Sharp pains.  Numbness in the back of the calf or the sole of the foot.  Leg weakness.  Severe back pain that makes movement difficult. These symptoms may  get worse when you cough, sneeze, or laugh, or when you sit or stand for long periods of time. Being overweight may also make symptoms worse. In some cases, symptoms may recur over time. How is this diagnosed? This condition may be diagnosed based on:  Your symptoms.  A physical exam. Your health care provider may ask you to do certain movements to check whether those movements trigger your symptoms.  You may have tests, including:  Blood tests.  X-rays.  MRI.  CT scan. How is this treated? In many cases, this condition improves on its own, without any treatment. However, treatment may include:  Reducing or modifying physical activity during periods of pain.  Exercising and stretching to strengthen your abdomen and improve the flexibility of your spine.  Icing and applying heat to the affected area.  Medicines that help:  To relieve pain and swelling.  To relax your muscles.  Injections of medicines that help to relieve pain, irritation, and inflammation around the sciatic nerve (steroids).  Surgery. Follow these instructions at home: Medicines   Take over-the-counter and prescription medicines only as told by your health care provider.  Do not drive or operate heavy machinery while taking prescription pain medicine. Managing pain   If directed, apply ice to the affected area.  Put ice in a plastic bag.  Place a towel between your skin and the bag.  Leave the ice on for 20 minutes, 2-3 times a day.  After icing, apply heat to the   heat to the affected area before you exercise or as often as told by your health care provider. Use the heat source that your health care provider recommends, such as a moist heat pack or a heating pad. ? Place a towel between your skin and the heat source. ? Leave the heat on for 20-30 minutes. ? Remove the heat if your skin turns bright red. This is especially important if you are unable to feel pain, heat, or cold. You may have a  greater risk of getting burned. Activity  Return to your normal activities as told by your health care provider. Ask your health care provider what activities are safe for you. ? Avoid activities that make your symptoms worse.  Take brief periods of rest throughout the day. Resting in a lying or standing position is usually better than sitting to rest. ? When you rest for longer periods, mix in some mild activity or stretching between periods of rest. This will help to prevent stiffness and pain. ? Avoid sitting for long periods of time without moving. Get up and move around at least one time each hour.  Exercise and stretch regularly, as told by your health care provider.  Do not lift anything that is heavier than 10 lb (4.5 kg) while you have symptoms of sciatica. When you do not have symptoms, you should still avoid heavy lifting, especially repetitive heavy lifting.  When you lift objects, always use proper lifting technique, which includes: ? Bending your knees. ? Keeping the load close to your body. ? Avoiding twisting. General instructions  Use good posture. ? Avoid leaning forward while sitting. ? Avoid hunching over while standing.  Maintain a healthy weight. Excess weight puts extra stress on your back and makes it difficult to maintain good posture.  Wear supportive, comfortable shoes. Avoid wearing high heels.  Avoid sleeping on a mattress that is too soft or too hard. A mattress that is firm enough to support your back when you sleep may help to reduce your pain.  Keep all follow-up visits as told by your health care provider. This is important. Contact a health care provider if:  You have pain that wakes you up when you are sleeping.  You have pain that gets worse when you lie down.  Your pain is worse than you have experienced in the past.  Your pain lasts longer than 4 weeks.  You experience unexplained weight loss. Get help right away if:  You lose control  of your bowel or bladder (incontinence).  You have: ? Weakness in your lower back, pelvis, buttocks, or legs that gets worse. ? Redness or swelling of your back. ? A burning sensation when you urinate. This information is not intended to replace advice given to you by your health care provider. Make sure you discuss any questions you have with your health care provider. Document Released: 05/10/2001 Document Revised: 10/20/2015 Document Reviewed: 01/23/2015 Elsevier Interactive Patient Education  2018 [place].  Radicular Pain Radicular pain is a type of pain that spreads from your back or neck along a spinal nerve. Spinal nerves are nerves that leave the spinal cord and go to the muscles. Radicular pain occurs when one of these nerves becomes irritated or squeezed (compressed). Radicular pain is sometimes called radiculopathy, radiculitis, or a pinched nerve. When you have this type of pain, you may also have weakness, numbness, or tingling in the area of your body that is supplied by the nerve. The pain may feel  sharp and burning. Spinal nerves leave the spinal cord through openings between the 24 bones (vertebrae) that make up the spine. Radicular pain is often caused by something pushing on a spinal nerve. This pushing may be done by a vertebra or by one of the round cushions between vertebrae (intervertebral disks). This can result from an injury, from wear and tear or aging of a disk, or from the growth of a bone spur that pushes on the nerve. Radicular pain can occur in various areas depending on which spinal nerve is affected:  Cervical radicular pain occurs in the neck. You may also feel pain, numbness, weakness, or tingling in the arms.  Thoracic radicular pain occurs in the mid-spine area. You would feel this pain in the back and chest. This type is rare.  Lumbar radicular pain occurs in the lower back area. You would feel this pain as low back pain. You may feel pain, numbness,  weakness, or tingling in the buttocks or legs. Sciatica is a type of lumbar radicular pain that shoots down the back of the leg.  Radicular pain often goes away when you follow instructions from your health care provider for relieving pain at home. Follow these instructions at home: Managing pain  If directed, apply ice to the affected area: ? Put ice in a plastic bag. ? Place a towel between your skin and the bag. ? Leave the ice on for 20 minutes, 2-3 times a day.  If directed, apply heat to the affected area as often as told by your health care provider. Use the heat source that your health care provider recommends, such as a moist heat pack or a heating pad. ? Place a towel between your skin and the heat source. ? Leave the heat on for 20-30 minutes. ? Remove the heat if your skin turns bright red. This is especially important if you are unable to feel pain, heat, or cold. You may have a greater risk of getting burned. Activity   Do not sit or rest in bed for long periods of time.  Try to stay as active as possible. Ask your health care provider what type of exercise or activity is best for you.  Avoid activities that make your pain worse, such as bending and lifting.  Do not lift anything that is heavier than 10 lb (4.5 kg). Practice using proper technique when lifting items. Proper lifting technique involves bending your knees and rising up.  Do strength and range-of-motion exercises only as told by your health care provider. General instructions  Take over-the-counter and prescription medicines only as told by your health care provider.  Pay attention to any changes in your symptoms.  Keep all follow-up visits as told by your health care provider. This is important. Contact a health care provider if:  Your pain and other symptoms get worse.  Your pain medicine is not helping.  Your pain has not improved after a few weeks of home care.  You have a fever. Get help  right away if:  You have severe pain, weakness, or numbness.  You have difficulty with bladder or bowel control. This information is not intended to replace advice given to you by your health care provider. Make sure you discuss any questions you have with your health care provider. Document Released: 06/23/2004 Document Revised: 10/22/2015 Document Reviewed: 12/10/2014 Elsevier Interactive Patient Education  [employer].

## 2017-12-14 LAB — LIPID PANEL
CHOL/HDL RATIO: 4.4 ratio (ref 0.0–4.4)
Cholesterol, Total: 170 mg/dL (ref 100–199)
HDL: 39 mg/dL — ABNORMAL LOW (ref >39)
Triglycerides: 446 mg/dL — ABNORMAL HIGH (ref 0–149)

## 2017-12-14 LAB — CMP14+EGFR
A/G RATIO: 0.8 — AB (ref 1.2–2.2)
ALBUMIN: 3.9 g/dL (ref 3.5–5.5)
ALK PHOS: 36 IU/L — AB (ref 39–117)
ALT: 9 IU/L (ref 0–32)
AST: 9 IU/L (ref 0–40)
BUN / CREAT RATIO: 10 (ref 9–23)
BUN: 12 mg/dL (ref 6–24)
CHLORIDE: 95 mmol/L — AB (ref 96–106)
CO2: 23 mmol/L (ref 20–29)
Calcium: 9.4 mg/dL (ref 8.7–10.2)
Creatinine, Ser: 1.2 mg/dL — ABNORMAL HIGH (ref 0.57–1.00)
GFR calc non Af Amer: 54 mL/min/{1.73_m2} — ABNORMAL LOW (ref >59)
GFR, EST AFRICAN AMERICAN: 63 mL/min/{1.73_m2} (ref >59)
GLUCOSE: 61 mg/dL — AB (ref 65–99)
Globulin, Total: 5 g/dL — ABNORMAL HIGH (ref 1.5–4.5)
POTASSIUM: 4.7 mmol/L (ref 3.5–5.2)
Sodium: 136 mmol/L (ref 134–144)
TOTAL PROTEIN: 8.9 g/dL — AB (ref 6.0–8.5)

## 2017-12-14 LAB — HEMOGLOBIN A1C
Est. average glucose Bld gHb Est-mCnc: 134 mg/dL
HEMOGLOBIN A1C: 6.3 % — AB (ref 4.8–5.6)

## 2017-12-14 LAB — CBC
HEMOGLOBIN: 7.9 g/dL — AB (ref 11.1–15.9)
Hematocrit: 23.9 % — ABNORMAL LOW (ref 34.0–46.6)
MCH: 27.8 pg (ref 26.6–33.0)
MCHC: 33.1 g/dL (ref 31.5–35.7)
MCV: 84 fL (ref 79–97)
PLATELETS: 278 10*3/uL (ref 150–450)
RBC: 2.84 x10E6/uL — AB (ref 3.77–5.28)
RDW: 17.3 % — ABNORMAL HIGH (ref 12.3–15.4)
WBC: 7.2 10*3/uL (ref 3.4–10.8)

## 2017-12-17 ENCOUNTER — Other Ambulatory Visit: Payer: Self-pay | Admitting: Nurse Practitioner

## 2017-12-17 MED ORDER — FENOFIBRATE 48 MG PO TABS: 48.0000 mg | ORAL_TABLET | Freq: Every day | ORAL | 1 refills | Status: DC

## 2017-12-19 ENCOUNTER — Other Ambulatory Visit: Payer: Self-pay | Admitting: Obstetrics and Gynecology

## 2017-12-19 DIAGNOSIS — Z1231 Encounter for screening mammogram for malignant neoplasm of breast: Secondary | ICD-10-CM

## 2018-01-08 ENCOUNTER — Ambulatory Visit (INDEPENDENT_AMBULATORY_CARE_PROVIDER_SITE_OTHER): Payer: Self-pay | Admitting: Orthopedic Surgery

## 2018-01-08 MED FILL — LISINOPRIL 5 MG TAB: 5 | 30 days supply | Qty: 30 | Fill #2

## 2018-01-08 MED FILL — METHOCARBAMOL 750 MG TABS: 750 | 20 days supply | Qty: 60 | Fill #1

## 2018-01-08 MED FILL — ?ATORVASTATIN 20 MG TABLET: 20 | 30 days supply | Qty: 30 | Fill #2

## 2018-01-08 MED FILL — ?GLIMEPIRIDE 4 MG TABLET: 4 | 30 days supply | Qty: 30 | Fill #4

## 2018-01-10 ENCOUNTER — Ambulatory Visit (INDEPENDENT_AMBULATORY_CARE_PROVIDER_SITE_OTHER): Payer: Self-pay | Admitting: Orthopaedic Surgery

## 2018-01-10 ENCOUNTER — Encounter (INDEPENDENT_AMBULATORY_CARE_PROVIDER_SITE_OTHER): Payer: Self-pay | Admitting: Orthopaedic Surgery

## 2018-01-10 VITALS — BP 119/82 | HR 88 | Ht 63.0 in | Wt 247.0 lb

## 2018-01-10 DIAGNOSIS — Z6841 Body Mass Index (BMI) 40.0 and over, adult: Secondary | ICD-10-CM

## 2018-01-10 DIAGNOSIS — G8929 Other chronic pain: Secondary | ICD-10-CM

## 2018-01-10 DIAGNOSIS — M545 Low back pain, unspecified: Secondary | ICD-10-CM

## 2018-01-10 DIAGNOSIS — E66813 Obesity, class 3: Secondary | ICD-10-CM

## 2018-01-10 NOTE — Addendum Note (Signed)
Addended by: Meyer Cory on: 01/10/2018 09:56 AM   Modules accepted: Orders

## 2018-01-10 NOTE — Progress Notes (Signed)
Office Visit Note   Patient: Desiree Hernandez           Date of Birth: May 26, 1971           MRN: [id number] Visit Date: 01/10/2018              Requested by: [name], NP [place], [place] [address] PCP: [name], NP   Assessment & Plan: Visit Diagnoses:  1. Chronic low back pain without sciatica, unspecified back pain laterality   2. Class 3 severe obesity due to excess calories with serious comorbidity and body mass index (BMI) of 40.0 to 44.9 in adult [facility])     Plan: We discussed diet and water exercise program due to her chronic back pain problems.  We will order a lumbar MRI scan make sure she does not have any significant pathology with her long history of chronic back pain and leg pain.  Last A1c was 6.3, prior to that it was 11.2.  Office follow-up after MRI scan.  Follow-Up Instructions: No follow-ups on file.   Orders:  No orders of the defined types were placed in this encounter.  No orders of the defined types were placed in this encounter.     Procedures: No procedures performed   Clinical Data: No additional findings.   Subjective: Chief Complaint  Patient presents with  . Lower Back - Pain    HPI 47 year old female with chronic low back pain for many years.  She is to work at [employer] for a while for about a year or year and a half and had to stop due to problems with her back.  She states in the morning when she gets up she has to use her daughter's walker she is applied for disability.  She is had some problems with diabetes and does not want to take Aleve she is concerned about her kidney.  She has stiffness in the morning difficulty bending describes it is like a knife sticking in her back states the pain runs down her leg more on the right leg than left leg.  She denies associated bowel or bladder symptoms.  Previous lumbar x-rays 10/27/2017 showed some mild L2 compression with progressive loss of height.  She denies any  history of falls.  She is been using some Robaxin.  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  Review of Systems positive for type 2 diabetes on oral medication.  Positive for depression back pain.  Previous surgeries include tubal ligation, also also previous toe surgery, anal fissure surgery.  14 point review of systems otherwise negative.   Objective: Vital Signs: BP 119/82   Pulse 88   Ht 5\' 3"  (1.6 m)   Wt 247 lb (112 kg)   BMI 43.75 kg/m   Physical Exam  Constitutional: She is oriented to person, place, and time. She appears well-developed.  HENT:  Head: Normocephalic.  Right Ear: External ear normal.  Left Ear: External ear normal.  Eyes: Pupils are equal, round, and reactive to light.  Neck: No tracheal deviation present. No thyromegaly present.  Cardiovascular: Normal rate.  Pulmonary/Chest: Effort normal.  Abdominal: Soft.  obesity  Neurological: She is alert and oriented to person, place, and time.  Skin: Skin is warm and dry.  Psychiatric: She has a normal mood and affect. Her behavior is normal.    Ortho Exam patient has good hip range of motion without limitation  of pain.  She has some pain with palpation of the lumbar spine sciatic notch.  Anterior tib gastrocsoleus is strong she can walk a few steps on her heels and toes but states it causes back pain.  Knee and ankle jerk are 1+ and symmetrical.  Pulses are intact.  Previous left great toenail removal with partial regrowth.  No inflammation or tenderness at the toe.  Pedal pulses are intact.  No pitting edema.  Specialty Comments:  No specialty comments available.  Imaging: No results found.   PMFS History: Patient Active Problem List   Diagnosis Date Noted  . Low back pain 12/13/2017  . Depression 10/04/2017  . Type 2 diabetes mellitus with hyperglycemia, without long-term current use of insulin ([place]) 09/01/2017   Past Medical History:  Diagnosis Date  . Arthritis     . Asthma   . Diabetes mellitus without complication ([place])   . Hyperlipidemia   . Hypertension   . Obesity     Family History  Problem Relation Age of Onset  . Heart disease Mother   . Heart disease Father   . Alzheimer's disease Father   . Diabetes Father   . Diabetes Sister   . Diabetes Paternal Aunt   . Diabetes Paternal Uncle     Past Surgical History:  Procedure Laterality Date  . ANAL FISSURE REPAIR    . TUBAL LIGATION     Social History   Occupational History  . Not on file  Tobacco Use  . Smoking status: Former Smoker    Types: Cigarettes  . Smokeless tobacco: Never Used  . Tobacco comment: Quit on 2013  Substance and Sexual Activity  . Alcohol use: Yes    Comment: occassional  . Drug use: No  . Sexual activity: Not Currently

## 2018-01-17 ENCOUNTER — Encounter: Payer: Self-pay | Admitting: Nurse Practitioner

## 2018-01-17 ENCOUNTER — Ambulatory Visit: Payer: Self-pay | Attending: Nurse Practitioner | Admitting: Nurse Practitioner

## 2018-01-17 VITALS — BP 118/82 | HR 82 | Temp 98.8°F | Ht 63.0 in | Wt 248.8 lb

## 2018-01-17 DIAGNOSIS — R200 Anesthesia of skin: Secondary | ICD-10-CM | POA: Insufficient documentation

## 2018-01-17 DIAGNOSIS — Z880 Allergy status to penicillin: Secondary | ICD-10-CM | POA: Insufficient documentation

## 2018-01-17 DIAGNOSIS — E781 Pure hyperglyceridemia: Secondary | ICD-10-CM | POA: Insufficient documentation

## 2018-01-17 DIAGNOSIS — G8929 Other chronic pain: Secondary | ICD-10-CM | POA: Insufficient documentation

## 2018-01-17 DIAGNOSIS — Z79899 Other long term (current) drug therapy: Secondary | ICD-10-CM | POA: Insufficient documentation

## 2018-01-17 DIAGNOSIS — E1165 Type 2 diabetes mellitus with hyperglycemia: Secondary | ICD-10-CM | POA: Insufficient documentation

## 2018-01-17 DIAGNOSIS — Z6841 Body Mass Index (BMI) 40.0 and over, adult: Secondary | ICD-10-CM | POA: Insufficient documentation

## 2018-01-17 DIAGNOSIS — I10 Essential (primary) hypertension: Secondary | ICD-10-CM | POA: Insufficient documentation

## 2018-01-17 DIAGNOSIS — M545 Low back pain, unspecified: Secondary | ICD-10-CM

## 2018-01-17 DIAGNOSIS — M1990 Unspecified osteoarthritis, unspecified site: Secondary | ICD-10-CM | POA: Insufficient documentation

## 2018-01-17 DIAGNOSIS — J45909 Unspecified asthma, uncomplicated: Secondary | ICD-10-CM | POA: Insufficient documentation

## 2018-01-17 DIAGNOSIS — Z7982 Long term (current) use of aspirin: Secondary | ICD-10-CM | POA: Insufficient documentation

## 2018-01-17 DIAGNOSIS — Z7984 Long term (current) use of oral hypoglycemic drugs: Secondary | ICD-10-CM | POA: Insufficient documentation

## 2018-01-17 DIAGNOSIS — E669 Obesity, unspecified: Secondary | ICD-10-CM | POA: Insufficient documentation

## 2018-01-17 LAB — GLUCOSE, POCT (MANUAL RESULT ENTRY): POC Glucose: 91 mg/dl (ref 70–99)

## 2018-01-17 MED ORDER — FENOFIBRATE 48 MG PO TABS: 48.0000 mg | ORAL_TABLET | Freq: Every day | ORAL | 1 refills | Status: AC

## 2018-01-17 MED ORDER — SITAGLIPTIN PHOS-METFORMIN HCL 50-1000 MG PO TABS: 1.0000 | ORAL_TABLET | Freq: Two times a day (BID) | ORAL | 3 refills | Status: AC

## 2018-01-17 MED FILL — FENOFIBRATE 48 MG TABLET: 48 | 30 days supply | Qty: 30 | Fill #0

## 2018-01-17 MED FILL — $JANUMET 50-1000 MG TABLET: 50-1000 | 30 days supply | Qty: 60 | Fill #0

## 2018-01-17 NOTE — Progress Notes (Signed)
Assessment & Plan:  Desiree Hernandez was seen today for follow-up.  Diagnoses and all orders for this visit:  Chronic bilateral low back pain without sciatica Work on losing weight to help reduce back pain. May alternate with heat and ice application for pain relief. May also alternate with acetaminophen and Ibuprofen as prescribed for back pain. Other alternatives include massage, acupuncture and water aerobics.  You must stay active and avoid a sedentary lifestyle.    Type 2 diabetes mellitus with hyperglycemia, without long-term current use of insulin (HCC) -     Glucose (CBG) -     sitaGLIPtin-metformin (JANUMET) 50-1000 MG tablet; Take 1 tablet by mouth 2 (two) times daily with a meal. Continue blood sugar control as discussed in office today, low carbohydrate diet, and regular physical exercise as tolerated, 150 minutes per week (30 min each day, 5 days per week, or 50 min 3 days per week). Keep blood sugar logs with fasting goal of 90-130 mg/dl, post prandial (after you eat) less than 180.  For Hypoglycemia: BS <60 and Hyperglycemia BS >400; contact the clinic ASAP. Annual eye exams and foot exams are recommended.   Hypertriglyceridemia -     fenofibrate (TRICOR) 48 MG tablet; Take 1 tablet (48 mg total) by mouth daily. INSTRUCTIONS: Work on a low fat, heart healthy diet and participate in regular aerobic exercise program by working out at least 150 minutes per week; 5 days a week-30 minutes per day. Avoid red meat, fried foods. junk foods, sodas, sugary drinks, unhealthy snacking, alcohol and smoking.  Drink at least 48oz of water per day and monitor your carbohydrate intake daily.    Patient has been counseled on age-appropriate routine health concerns for screening and prevention. These are reviewed and up-to-date. Referrals have been placed accordingly. Immunizations are up-to-date or declined.    Subjective:   Chief Complaint  Patient presents with  . Follow-up    Pt. is here to  follow-up on back pain. Pt. stated she is still having back pain. Pt. have an upcoming appt with Orthopedic.    HPI Desiree Hernandez 47 y.o. female presents to office today for follow up to back pain.    Back Pain Chronic. Pain not well controlled. She is currently being followed by orthopedic surgery  (Dr. Lorin Mercy). Lumbar MRI is pending. She continues to endorse low back pain and BLE numbness with prolonged standing. At this time I will defer her back pain to the orthopedic specialist. She denies any bowel or bladder incontinence.   Hyperlipidemia Patient presents for follow up to hyperlipidemia/hypertriglyceridemia.  She  endorses medication compliance. She is not diet compliant and denies skin xanthelasma or statin intolerance including myalgias.  Lab Results  Component Value Date   CHOL 170 12/13/2017   Lab Results  Component Value Date   HDL 39 (L) 12/13/2017   Lab Results  Component Value Date   LDLCALC Comment 12/13/2017   Lab Results  Component Value Date   TRIG 446 (H) 12/13/2017   Lab Results  Component Value Date   CHOLHDL 4.4 12/13/2017      Review of Systems  Constitutional: Negative for fever, malaise/fatigue and weight loss.  HENT: Negative.  Negative for nosebleeds.   Eyes: Negative.  Negative for blurred vision, double vision and photophobia.  Respiratory: Negative.  Negative for cough and shortness of breath.   Cardiovascular: Negative.  Negative for chest pain, palpitations and leg swelling.  Gastrointestinal: Negative.  Negative for heartburn, nausea and vomiting.  Musculoskeletal:  Negative.  Negative for myalgias.  Neurological: Negative.  Negative for dizziness, focal weakness, seizures and headaches.  Psychiatric/Behavioral: Negative.  Negative for suicidal ideas.    Past Medical History:  Diagnosis Date  . Arthritis   . Asthma   . Diabetes mellitus without complication ([place])   . Hyperlipidemia   . Hypertension   . Obesity     Past Surgical  History:  Procedure Laterality Date  . ANAL FISSURE REPAIR    . TUBAL LIGATION      Family History  Problem Relation Age of Onset  . Heart disease Mother   . Heart disease Father   . Alzheimer's disease Father   . Diabetes Father   . Diabetes Sister   . Diabetes Paternal Aunt   . Diabetes Paternal Uncle     Social History Reviewed with no changes to be made today.   Outpatient Medications Prior to Visit  Medication Sig Dispense Refill  . Acetaminophen (TYLENOL ARTHRITIS PAIN PO) Take 625 mg by mouth 2 (two) times daily as needed.    Marland Kitchen aspirin EC 81 MG tablet Take 1 tablet (81 mg total) by mouth daily. 150 tablet 1  . atorvastatin (LIPITOR) 20 MG tablet Take 1 tablet (20 mg total) by mouth daily. 90 tablet 3  . Blood Glucose Monitoring Suppl (TRUE METRIX METER) DEVI 1 kit by Does not apply route 4 (four) times daily. 1 Device 0  . Cyanocobalamin (VITAMIN B-12 PO) Take by mouth.    Marland Kitchen glimepiride (AMARYL) 4 MG tablet Take 1 tablet (4 mg total) by mouth daily before breakfast. 90 tablet 1  . glucose blood (TRUE METRIX BLOOD GLUCOSE TEST) test strip Use as instructed 100 each 12  . lisinopril (PRINIVIL,ZESTRIL) 5 MG tablet Take 1 tablet (5 mg total) by mouth daily. 90 tablet 3  . methocarbamol (ROBAXIN) 750 MG tablet Take 1 tablet (750 mg total) by mouth 3 (three) times daily. X 1 week then prn muscle spasm 60 tablet 1  . TRUEPLUS LANCETS 28G MISC 28 g by Does not apply route 4 (four) times daily. 120 each 3  . fenofibrate (TRICOR) 48 MG tablet Take 1 tablet (48 mg total) by mouth daily. 90 tablet 1  . sitaGLIPtin-metformin (JANUMET) 50-1000 MG tablet Take 1 tablet by mouth 2 (two) times daily with a meal. 60 tablet 3  . naproxen (NAPROSYN) 500 MG tablet Take 1 tablet (500 mg total) by mouth 2 (two) times daily with a meal. X 7 days then prn pain (Patient not taking: Reported on 01/17/2018) 60 tablet 0   No facility-administered medications prior to visit.     Allergies  Allergen  Reactions  . Penicillins Rash    Childhood allergy Has patient had a PCN reaction causing immediate rash, facial/tongue/throat swelling, SOB or lightheadedness with hypotension: Yes Has patient had a PCN reaction causing severe rash involving mucus membranes or skin necrosis: No Has patient had a PCN reaction that required hospitalization No Has patient had a PCN reaction occurring within the last 10 years: No If all of the above answers are "NO", then may proceed with Cephalosporin use.        Objective:    BP 118/82 (BP Location: Right Arm, Patient Position: Sitting, Cuff Size: Large)   Pulse 82   Temp 98.8 F (37.1 C) (Oral)   Ht _0  (1.6 m)   Wt 248 lb 12.8 oz (112.9 kg)   SpO2 98%   BMI 44.07 kg/m  Wt Readings from Last  3 Encounters:  01/17/18 248 lb 12.8 oz (112.9 kg)  01/10/18 247 lb (112 kg)  12/13/17 247 lb (112 kg)    Physical Exam  Constitutional: She is oriented to person, place, and time. She appears well-developed and well-nourished. She is cooperative.  HENT:  Head: Normocephalic and atraumatic.  Eyes: EOM are normal.  Neck: Normal range of motion.  Cardiovascular: Normal rate, regular rhythm, normal heart sounds and intact distal pulses. Exam reveals no gallop and no friction rub.  No murmur heard. Pulmonary/Chest: Effort normal and breath sounds normal. No tachypnea. No respiratory distress. She has no decreased breath sounds. She has no wheezes. She has no rhonchi. She has no rales. She exhibits no tenderness.  Abdominal: Bowel sounds are normal.  Musculoskeletal: Normal range of motion. She exhibits no edema.  Neurological: She is alert and oriented to person, place, and time. Coordination normal.  Skin: Skin is warm and dry.  Psychiatric: She has a normal mood and affect. Her behavior is normal. Judgment and thought content normal.  Nursing note and vitals reviewed.        Patient has been counseled extensively about nutrition and exercise as  well as the importance of adherence with medications and regular follow-up. The patient was given clear instructions to go to ER or return to medical center if symptoms don't improve, worsen or new problems develop. The patient verbalized understanding.   Follow-up: Return in about 2 months (around 03/19/2018).   [name], FNP-BC [facility] and [place], [place]   01/25/2018, 6:07 PM

## 2018-01-24 ENCOUNTER — Ambulatory Visit (HOSPITAL_COMMUNITY)
Admission: RE | Admit: 2018-01-24 | Discharge: 2018-01-24 | Disposition: A | Discharge status: Home / Self Care | Payer: Self-pay | Source: Ambulatory Visit | Attending: Orthopaedic Surgery | Admitting: Orthopaedic Surgery

## 2018-01-24 DIAGNOSIS — M8938 Hypertrophy of bone, other site: Secondary | ICD-10-CM | POA: Insufficient documentation

## 2018-01-24 DIAGNOSIS — M48061 Spinal stenosis, lumbar region without neurogenic claudication: Secondary | ICD-10-CM | POA: Insufficient documentation

## 2018-01-24 DIAGNOSIS — M5146 Schmorl's nodes, lumbar region: Secondary | ICD-10-CM | POA: Insufficient documentation

## 2018-01-24 DIAGNOSIS — G8929 Other chronic pain: Secondary | ICD-10-CM | POA: Insufficient documentation

## 2018-01-24 DIAGNOSIS — M5144 Schmorl's nodes, thoracic region: Secondary | ICD-10-CM | POA: Insufficient documentation

## 2018-01-24 DIAGNOSIS — M545 Low back pain, unspecified: Secondary | ICD-10-CM

## 2018-01-24 DIAGNOSIS — M5126 Other intervertebral disc displacement, lumbar region: Secondary | ICD-10-CM | POA: Insufficient documentation

## 2018-01-25 ENCOUNTER — Encounter: Payer: Self-pay | Admitting: Nurse Practitioner

## 2018-01-31 ENCOUNTER — Ambulatory Visit (INDEPENDENT_AMBULATORY_CARE_PROVIDER_SITE_OTHER): Payer: Self-pay | Admitting: Orthopaedic Surgery

## 2018-01-31 ENCOUNTER — Encounter (INDEPENDENT_AMBULATORY_CARE_PROVIDER_SITE_OTHER): Payer: Self-pay | Admitting: Orthopaedic Surgery

## 2018-01-31 VITALS — BP 105/72 | HR 91 | Ht 63.0 in | Wt 248.0 lb

## 2018-01-31 DIAGNOSIS — F329 Major depressive disorder, single episode, unspecified: Secondary | ICD-10-CM

## 2018-01-31 DIAGNOSIS — F32A Depression, unspecified: Secondary | ICD-10-CM

## 2018-01-31 DIAGNOSIS — M545 Low back pain: Secondary | ICD-10-CM

## 2018-01-31 DIAGNOSIS — G8929 Other chronic pain: Secondary | ICD-10-CM

## 2018-01-31 NOTE — Progress Notes (Signed)
Office Visit Note   Patient: Desiree Hernandez           Date of Birth: Dec 31, 1970           MRN: [id number] Visit Date: 01/31/2018              Requested by: [name], NP [place], [place] [address] PCP: [name], NP   Assessment & Plan: Visit Diagnoses:  1. Chronic bilateral low back pain without sciatica   2. Depression, unspecified depression type     Plan: Lumbar MRI scan is reviewed.  I gave her a copy of the report.  Her diabetes has had significant improvement in her A1c.  MRI scan shows some lumbar facet degenerative changes there is some mild narrowing but no areas of moderately severe or severe compression.  She needs to start on antidepressant medicine with her PCP which is aggravating her pain problems.  She needs to work on dieting gradual weight loss gradual increase in activities.  Some days she is laying in bed all day has been minimally active.  I plan to recheck her in 6 months.  I think when she gets started on some medication for depression it should likely improve her back pain symptoms.  If some weight loss and antidepressant medication does not help her symptoms when she turns 7 months we will consider ordering an injection at the L4-5 level.  Follow-Up Instructions: Return in about 6 months (around 08/01/2018).   Orders:  No orders of the defined types were placed in this encounter.  No orders of the defined types were placed in this encounter.     Procedures: No procedures performed   Clinical Data: No additional findings.   Subjective: Chief Complaint  Patient presents with  . Lower Back - Pain, Follow-up    MRI Lumbar Review    HPI 47 year old female returns with chronic back pain, diabetes and also depression.  MRI scan has been obtained which shows some facet arthropathy with mild narrowing but no areas of severe foraminal or central compression.  Review of Systems 14 point review of systems updated unchanged from  01/10/2018 office visit.   Objective: Vital Signs: BP 105/72   Pulse 91   Ht 5\' 3"  (1.6 m)   Wt 248 lb (112.5 kg)   BMI 43.93 kg/m   Physical Exam  Constitutional: She is oriented to person, place, and time. She appears well-developed.  HENT:  Head: Normocephalic.  Right Ear: External ear normal.  Left Ear: External ear normal.  Eyes: Pupils are equal, round, and reactive to light.  Neck: No tracheal deviation present. No thyromegaly present.  Cardiovascular: Normal rate.  Pulmonary/Chest: Effort normal.  Abdominal: Soft.  Neurological: She is alert and oriented to person, place, and time.  Skin: Skin is warm and dry.  Psychiatric: She has a normal mood and affect. Her behavior is normal.    Ortho Exam morbid obesity.  Heel toe gait is normal no plantar foot lesions.  Knees  reach full extension.  Anterior tib gastrocsoleus is intact.  No pitting edema.  Pedal pulses are intact.  Specialty Comments:  No specialty comments available.  Imaging: CLINICAL DATA:  Initial evaluation for chronic lower back pain without sciatica.  EXAM: MRI LUMBAR SPINE WITHOUT CONTRAST  TECHNIQUE: Multiplanar, multisequence MR imaging of the lumbar spine was performed. No intravenous contrast was administered.  COMPARISON:  Prior radiograph from 12/13/2017.  FINDINGS: Segmentation: Normal segmentation. Lowest well-formed disc labeled  the L5-S1 level.  Alignment: Trace retrolisthesis of L2 on L3. Alignment otherwise normal with preservation of the normal lumbar lordosis.  Vertebrae: Chronic endplate Schmorl's nodes seen at the superior endplates of P71 through L4 with associated chronic height loss. Vertebral body heights otherwise maintained with no evidence for acute or subacute fracture. Bone marrow signal intensity somewhat heterogeneous without discrete or worrisome osseous lesions. Mild reactive marrow edema about the right L4-5 facet due to facet arthritis (series 6,  image 6).  Conus medullaris and cauda equina: Conus extends to the L1 level. Conus and cauda equina appear normal.  Paraspinal and other soft tissues: Paraspinous soft tissues within normal limits. Visualized visceral structures unremarkable.  Disc levels:  T10-11: Seen only on sagittal projection. Mild disc bulge with disc desiccation. Bilateral facet hypertrophy. No significant stenosis.  T11-12: Seen only on sagittal projection. Mild disc bulge with disc desiccation. Mild facet hypertrophy. No stenosis.  T12-L1: Seen only on sagittal projection. Minimal disc bulge. No stenosis.  L1-2: Circumferential disc bulge with disc desiccation and intervertebral disc space narrowing. Mild reactive endplate changes with endplate osteophytic spurring. No significant canal or foraminal stenosis.  L2-3: Mild diffuse disc bulge with disc desiccation. Mild bilateral facet hypertrophy. No significant canal or foraminal stenosis.  L3-4: Mild diffuse disc bulge. Superimposed shallow left foraminal/extraforaminal disc protrusion, closely approximating the exiting left L3 nerve root (series 5, image 12). Mild to moderate facet hypertrophy. Borderline mild spinal stenosis. Mild left L3 foraminal narrowing.  L4-5: Mild diffuse disc bulge with disc desiccation. Moderate facet hypertrophy with associated small bilateral joint effusions. Mild reactive marrow edema on the right. Resultant mild to moderate bilateral lateral recess narrowing with mild spinal stenosis. Mild bilateral L4 foraminal narrowing.  L5-S1: Disc desiccation without significant disc bulge. Mild facet hypertrophy. Epidural lipomatosis with secondary compression of the distal thecal sac. No other significant spinal stenosis.  IMPRESSION: 1. Disc bulge with moderate facet hypertrophy at L4-5 with resultant mild to moderate canal and bilateral lateral recess stenosis. 2. Shallow left foraminal/extraforaminal disc  protrusion at L3-4, closely approximating and potentially irritating the exiting left L3 nerve root. 3. Mild to moderate multilevel facet hypertrophy extending from L2-3 through L5-S1, most pronounced at the L4-5 level on the right. Finding could contribute to lower back pain. 4. Multilevel chronic endplate Schmorl's nodes with associated vertebral body height loss extending from T11 through L4.   Electronically Signed   By: Jeannine Boga M.D.   On: 01/24/2018 20:00   PMFS History: Patient Active Problem List   Diagnosis Date Noted  . Class 3 severe obesity due to excess calories with serious comorbidity and body mass index (BMI) of 40.0 to 44.9 in adult ([place]) 01/10/2018  . Chronic low back pain without sciatica 12/13/2017  . Depression 10/04/2017  . Type 2 diabetes mellitus with hyperglycemia, without long-term current use of insulin ([place]) 09/01/2017   Past Medical History:  Diagnosis Date  . Arthritis   . Asthma   . Diabetes mellitus without complication ([place])   . Hyperlipidemia   . Hypertension   . Obesity     Family History  Problem Relation Age of Onset  . Heart disease Mother   . Heart disease Father   . Alzheimer's disease Father   . Diabetes Father   . Diabetes Sister   . Diabetes Paternal Aunt   . Diabetes Paternal Uncle     Past Surgical History:  Procedure Laterality Date  . ANAL FISSURE REPAIR    . TUBAL  LIGATION     Social History   Occupational History  . Not on file  Tobacco Use  . Smoking status: Former Smoker    Types: Cigarettes  . Smokeless tobacco: Never Used  . Tobacco comment: Quit on 2013  Substance and Sexual Activity  . Alcohol use: Yes    Comment: occassional  . Drug use: No  . Sexual activity: Not Currently

## 2018-02-01 ENCOUNTER — Ambulatory Visit: Payer: Self-pay | Attending: Family Medicine | Admitting: Physician Assistant

## 2018-02-01 VITALS — BP 133/87 | HR 106 | Temp 100.1°F | Resp 18 | Ht 63.0 in | Wt 251.0 lb

## 2018-02-01 DIAGNOSIS — M1990 Unspecified osteoarthritis, unspecified site: Secondary | ICD-10-CM | POA: Insufficient documentation

## 2018-02-01 DIAGNOSIS — Z7982 Long term (current) use of aspirin: Secondary | ICD-10-CM | POA: Insufficient documentation

## 2018-02-01 DIAGNOSIS — E785 Hyperlipidemia, unspecified: Secondary | ICD-10-CM | POA: Insufficient documentation

## 2018-02-01 DIAGNOSIS — F32A Depression, unspecified: Secondary | ICD-10-CM

## 2018-02-01 DIAGNOSIS — F419 Anxiety disorder, unspecified: Secondary | ICD-10-CM | POA: Insufficient documentation

## 2018-02-01 DIAGNOSIS — Z79899 Other long term (current) drug therapy: Secondary | ICD-10-CM | POA: Insufficient documentation

## 2018-02-01 DIAGNOSIS — Z6841 Body Mass Index (BMI) 40.0 and over, adult: Secondary | ICD-10-CM | POA: Insufficient documentation

## 2018-02-01 DIAGNOSIS — E669 Obesity, unspecified: Secondary | ICD-10-CM | POA: Insufficient documentation

## 2018-02-01 DIAGNOSIS — M549 Dorsalgia, unspecified: Secondary | ICD-10-CM | POA: Insufficient documentation

## 2018-02-01 DIAGNOSIS — E1165 Type 2 diabetes mellitus with hyperglycemia: Secondary | ICD-10-CM | POA: Insufficient documentation

## 2018-02-01 DIAGNOSIS — I10 Essential (primary) hypertension: Secondary | ICD-10-CM | POA: Insufficient documentation

## 2018-02-01 DIAGNOSIS — Z7984 Long term (current) use of oral hypoglycemic drugs: Secondary | ICD-10-CM | POA: Insufficient documentation

## 2018-02-01 DIAGNOSIS — Z880 Allergy status to penicillin: Secondary | ICD-10-CM | POA: Insufficient documentation

## 2018-02-01 DIAGNOSIS — F329 Major depressive disorder, single episode, unspecified: Secondary | ICD-10-CM | POA: Insufficient documentation

## 2018-02-01 MED ORDER — SERTRALINE HCL 50 MG PO TABS: 50.0000 mg | ORAL_TABLET | Freq: Every day | ORAL | 3 refills | Status: DC

## 2018-02-01 MED FILL — SERTRALINE HCL 50 MG TABS: 50 | 30 days supply | Qty: 30 | Fill #0

## 2018-02-01 NOTE — Patient Instructions (Signed)
Take 1/2 tablet of zoloft for the first week then increase to 1 daily

## 2018-02-01 NOTE — Progress Notes (Signed)
Patient ID: Desiree Hernandez, female   DOB: 06-20-1970, 47 y.o.   MRN: [id number]    Desiree Hernandez, is a 47 y.o. female  [id number]:[id number]  [id number]  DOB - 30-Nov-1970  Subjective:  Chief Complaint and HPI: Desiree Hernandez is a 47 y.o. female here today for depression and anxiety and wants to be started on meds.  She saw the doctor for her back yesterday and he feels she would do better with her pain if her depression was also being treated.  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  She is currently not SA.  Admits to low level of activity due to back pain.  Poor diet.  +anhedonia.  No SI/HI.  No weapons in the home.  Depression and anxiety tend to run in her family  Social:  Adult children live with her, single/not in a relationship, unemployed  ROS:   Constitutional:  No f/c, No night sweats, No unexplained weight loss. EENT:  No vision changes, No blurry vision, No hearing changes. No mouth, throat, or ear problems.  Respiratory: No cough, No SOB Cardiac: No CP, no palpitations GI:  No abd pain, No N/V/D. GU: No Urinary s/sx Musculoskeletal: No joint pain Neuro: No headache, no dizziness, no motor weakness.  Skin: No rash Endocrine:  No polydipsia. No polyuria.  Psych: Denies SI/HI  No problems updated.  ALLERGIES: Allergies  Allergen Reactions  . Penicillins Rash    Childhood allergy Has patient had a PCN reaction causing immediate rash, facial/tongue/throat swelling, SOB or lightheadedness with hypotension: Yes Has patient had a PCN reaction causing severe rash involving mucus membranes or skin necrosis: No Has patient had a PCN reaction that required hospitalization No Has patient had a PCN reaction occurring within the last 10 years: No If all of the above answers are "NO", then may proceed with Cephalosporin use.     PAST MEDICAL HISTORY: Past Medical History:  Diagnosis Date  . Arthritis   . Asthma    . Diabetes mellitus without complication ([place])   . Hyperlipidemia   . Hypertension   . Obesity     MEDICATIONS AT HOME: Prior to Admission medications   Medication Sig Start Date End Date Taking? Authorizing Provider  Acetaminophen (TYLENOL ARTHRITIS PAIN PO) Take 625 mg by mouth 2 (two) times daily as needed.    [provider]  aspirin EC 81 MG tablet Take 1 tablet (81 mg total) by mouth daily. 08/10/17   Brayton Caves, PA-C  atorvastatin (LIPITOR) 20 MG tablet Take 1 tablet (20 mg total) by mouth daily. 08/10/17   Brayton Caves, PA-C  Blood Glucose Monitoring Suppl (TRUE METRIX METER) DEVI 1 kit by Does not apply route 4 (four) times daily. 08/10/17   Brayton Caves, PA-C  Cyanocobalamin (VITAMIN B-12 PO) Take by mouth.    [provider]  fenofibrate (TRICOR) 48 MG tablet Take 1 tablet (48 mg total) by mouth daily. 01/17/18   [name], NP  glimepiride (AMARYL) 4 MG tablet Take 1 tablet (4 mg total) by mouth daily before breakfast. 09/01/17   [name], NP  glucose blood (TRUE METRIX BLOOD GLUCOSE TEST) test strip Use as instructed 08/10/17   Brayton Caves, PA-C  lisinopril (PRINIVIL,ZESTRIL) 5 MG tablet Take 1 tablet (5 mg total) by mouth daily. 08/10/17   Brayton Caves, PA-C  methocarbamol (ROBAXIN) 750 MG tablet Take 1 tablet (750 mg  total) by mouth 3 (three) times daily. X 1 week then prn muscle spasm 12/13/17   [name], NP  naproxen (NAPROSYN) 500 MG tablet Take 1 tablet (500 mg total) by mouth 2 (two) times daily with a meal. X 7 days then prn pain Patient not taking: Reported on 01/17/2018 12/13/17   [name], NP  sertraline (ZOLOFT) 50 MG tablet Take 1 tablet (50 mg total) by mouth daily. 02/01/18   Argentina Donovan, PA-C  sitaGLIPtin-metformin (JANUMET) 50-1000 MG tablet Take 1 tablet by mouth 2 (two) times daily with a meal. 01/17/18   [name], NP  TRUEPLUS LANCETS 28G MISC 28 g by Does not apply route 4 (four) times daily.  08/10/17   Brayton Caves, PA-C   Depression screen [facility] 2/9 02/01/2018 01/17/2018 12/13/2017  Decreased Interest 3 1 1   Down, Depressed, Hopeless 3 1 2   PHQ - 2 Score 6 2 3   Altered sleeping 3 2 1   Tired, decreased energy 3 3 1   Change in appetite 3 1 1   Feeling bad or failure about yourself  3 1 2   Trouble concentrating 3 0 3  Moving slowly or fidgety/restless 2 0 2  Suicidal thoughts 0 0 0  PHQ-9 Score 23 9 13    GAD 7 : Generalized Anxiety Score 02/01/2018 01/17/2018 12/13/2017 11/06/2017  Nervous, Anxious, on Edge 3 2 3 2   Control/stop worrying 3 2 2 1   Worry too much - different things 3 2 3 2   Trouble relaxing 3 1 3  0  Restless 2 0 3 0  Easily annoyed or irritable 3 3 3 1   Afraid - awful might happen 3 0 3 0  Total GAD 7 Score 20 10 20 6       Objective:  EXAM:   Vitals:   02/01/18 1426  BP: 133/87  Pulse: (!) 106  Resp: 18  Temp: 100.1 F (37.8 C)  TempSrc: Oral  SpO2: 97%  Weight: 251 lb (113.9 kg)  Height: 5' 3"  (1.6 m)    General appearance : A&OX3. NAD. Non-toxic-appearing, pleasant, alert HEENT: Atraumatic and Normocephalic.  PERRLA. EOM intact.  Neck: supple, no JVD. No cervical lymphadenopathy. No thyromegaly Chest/Lungs:  Breathing-non-labored, Good air entry bilaterally, breath sounds normal without rales, rhonchi, or wheezing  CVS: S1 S2 regular, no murmurs, gallops, rubs  Extremities: Bilateral Lower Ext shows no edema, both legs are warm to touch with = pulse throughout Neurology:  CN II-XII grossly intact, Non focal.   Psych:  TP linear. J/I WNL. Normal speech. Appropriate eye contact and affect.  Skin:  No Rash  Data Review Lab Results  Component Value Date   HGBA1C 6.3 (H) 12/13/2017   HGBA1C 11.2 09/01/2017     Assessment & Plan   1. Depression, unspecified depression type Try Zoloft 58m 1/2 tablet daily X 1 week then increase dose in 1 week.  F/up 1 month as planned.  Self-care discussed at length.   - TSH  2. Type 2 diabetes mellitus  with hyperglycemia, without long-term current use of insulin (HCC) Controlled-continue current regimen and continue to eliminate sugar/white carbs from diet.    3. Anxiety Try Zoloft 556m1/2 tablet daily X 1 week then increase dose in 1 week.  F/up 1 month as planned.  Self-care discussed at length.   - Vitamin D, 25-hydroxy  Spent 303m face to face discussing self-care, stress management techniques, etc.    Patient have been counseled extensively about nutrition and exercise  Return for keep 10/18 appt with Zelda.  The patient was given clear instructions to go to ER or return to medical center if symptoms don't improve, worsen or new problems develop. The patient verbalized understanding. The patient was told to call to get lab results if they haven't heard anything in the next week.     Freeman Caldron, PA-C [facility], [place]   02/01/2018, 2:48 PM

## 2018-02-02 ENCOUNTER — Telehealth: Payer: Self-pay | Admitting: *Deleted

## 2018-02-02 ENCOUNTER — Other Ambulatory Visit: Payer: Self-pay | Admitting: Physician Assistant

## 2018-02-02 LAB — TSH: TSH: 3.81 u[IU]/mL (ref 0.450–4.500)

## 2018-02-02 LAB — VITAMIN D 25 HYDROXY (VIT D DEFICIENCY, FRACTURES): Vit D, 25-Hydroxy: 8.7 ng/mL — ABNORMAL LOW (ref 30.0–100.0)

## 2018-02-02 MED ORDER — VITAMIN D (ERGOCALCIFEROL) 1.25 MG (50000 UNIT) PO CAPS: 50000.0000 [IU] | ORAL_CAPSULE | ORAL | 0 refills | Status: DC

## 2018-02-02 MED FILL — VIT D2 1.25 MG (50,000 UNIT: 1.25 MG | 28 days supply | Qty: 4 | Fill #0

## 2018-02-02 NOTE — Telephone Encounter (Signed)
-----   Message from Argentina Donovan, [place] sent at 02/02/2018  2:00 PM EDT ----- Please call patient:  Your vitamin D is extremely low.  This can contribute to muscle aches, anxiety, fatigue, and depression.  I have sent a prescription to our pharmacy for you to take once a week.  We will recheck this level in 3-4 months.  Your thyroid studies were normal.  Follow-up as planned.  Thanks, Freeman Caldron, PA-C

## 2018-02-02 NOTE — Telephone Encounter (Signed)
Patient verified DOB Patient is aware of needing to begin vitamin d supplement which is at the [facility] pharmacy to address extremely low level. Patient is also aware of thyroid being normal and to follow up as planned. No further questions.

## 2018-02-12 MED FILL — LISINOPRIL 5 MG TAB: 5 | 30 days supply | Qty: 30 | Fill #3

## 2018-02-12 MED FILL — ?ATORVASTATIN 20 MG TABLET: 20 | 30 days supply | Qty: 30 | Fill #3

## 2018-02-12 MED FILL — FENOFIBRATE 48 MG TABLET: 48 | 30 days supply | Qty: 30 | Fill #1

## 2018-02-15 ENCOUNTER — Encounter (HOSPITAL_COMMUNITY): Payer: Self-pay | Admitting: *Deleted

## 2018-02-15 ENCOUNTER — Ambulatory Visit (HOSPITAL_COMMUNITY)
Admission: RE | Admit: 2018-02-15 | Discharge: 2018-02-15 | Disposition: A | Discharge status: Home / Self Care | Payer: Self-pay | Source: Ambulatory Visit | Attending: Obstetrics and Gynecology | Admitting: Obstetrics and Gynecology

## 2018-02-15 ENCOUNTER — Ambulatory Visit
Admission: RE | Admit: 2018-02-15 | Discharge: 2018-02-15 | Disposition: A | Discharge status: Home / Self Care | Payer: No Typology Code available for payment source | Source: Ambulatory Visit | Attending: Obstetrics and Gynecology | Admitting: Obstetrics and Gynecology

## 2018-02-15 VITALS — BP 128/76 | Ht 63.0 in | Wt 245.8 lb

## 2018-02-15 DIAGNOSIS — Z01419 Encounter for gynecological examination (general) (routine) without abnormal findings: Secondary | ICD-10-CM

## 2018-02-15 DIAGNOSIS — Z1231 Encounter for screening mammogram for malignant neoplasm of breast: Secondary | ICD-10-CM

## 2018-02-15 NOTE — Progress Notes (Signed)
No complaints today.   Pap Smear: Pap smear completed today. Last Pap smear was in 2015 at Dr. Marveen Reeks office in [facility] and normal per patient. Per patient has no history of an abnormal Pap smear. No Pap smear results are in Epic.  Physical exam: Breasts Breasts symmetrical. No skin abnormalities bilateral breasts. No nipple retraction bilateral breasts. No nipple discharge bilateral breasts. No lymphadenopathy. No lumps palpated bilateral breasts. No complaints of pain or tenderness on exam. Referred patient to the [facility] for a screening mammogram. Appointment scheduled for Thursday, February 15, 2018 at 1440.        Pelvic/Bimanual   Ext Genitalia No lesions, no swelling and no discharge observed on external genitalia.         Vagina Vagina pink and normal texture. No lesions or discharge observed in vagina.          Cervix Cervix is present. Cervix pink and of normal texture. No discharge observed.     Uterus Uterus is present and palpable. Uterus in normal position and normal size.        Adnexae Bilateral ovaries present and unable to palpate. No tenderness on palpation.         Rectovaginal No rectal exam completed today since patient had no rectal complaints. No skin abnormalities observed on exam.    Smoking History: Patient is a former smoker that quit 08/29/2011.  Patient Navigation: Patient education provided. Access to services provided for patient through [facility] program.   Breast and Cervical CancerRisk Assessment: Patient has a family history of a great maternal aunt having breast cancer. Patient has no known genetic mutations or history of radiation treatment to the chest before age 59. Patient has no history of cervical dysplasia, immunocompromised, or DES exposure in-utero.  Risk Assessment    Risk Scores      02/15/2018   Last edited by: Armond Hang, LPN   5-year risk: 0.7 %   Lifetime risk: 7.6 %

## 2018-02-15 NOTE — Patient Instructions (Signed)
Explained breast self awareness with Desiree Hernandez. Let patient know [facility] will cover Pap smears and HPV typing every 5 years unless has a history of abnormal Pap smears. Referred patient to the [facility] for a screening mammogram. Appointment scheduled for Thursday, February 15, 2018 at 1440. Let patient know will follow up with her within the next couple weeks with results of Pap smear by letter or phone. Informed patient that the [facility] will follow-up with her within the next couple of weeks with results of mammogram by letter or phone. Desiree Hernandez verbalized understanding.  Gaspare Netzel, Arvil Chaco, RN 2:36 PM

## 2018-02-16 ENCOUNTER — Encounter (HOSPITAL_COMMUNITY): Payer: Self-pay | Admitting: *Deleted

## 2018-02-16 LAB — CYTOLOGY - PAP
Diagnosis: NEGATIVE
HPV: NOT DETECTED

## 2018-02-22 ENCOUNTER — Encounter: Payer: Self-pay | Admitting: Nurse Practitioner

## 2018-02-22 ENCOUNTER — Ambulatory Visit: Payer: Self-pay | Attending: Nurse Practitioner

## 2018-03-01 MED FILL — SERTRALINE HCL 50 MG TABS: 50 | 30 days supply | Qty: 30 | Fill #1

## 2018-03-01 MED FILL — GLIMEPIRIDE 4 MG TABLET: 4 | 30 days supply | Qty: 30 | Fill #5

## 2018-03-13 MED FILL — LISINOPRIL 5 MG TAB: 5 | 30 days supply | Qty: 30 | Fill #4

## 2018-03-13 MED FILL — $JANUMET 50-1000 MG TABLET: 50-1000 | 90 days supply | Qty: 180 | Fill #1

## 2018-03-13 MED FILL — FENOFIBRATE 48 MG TABLET: 48 | 30 days supply | Qty: 30 | Fill #2

## 2018-03-13 MED FILL — ?ATORVASTATIN 20 MG TABLET: 20 | 30 days supply | Qty: 30 | Fill #4

## 2018-03-15 DIAGNOSIS — E1165 Type 2 diabetes mellitus with hyperglycemia: Secondary | ICD-10-CM

## 2018-03-16 ENCOUNTER — Ambulatory Visit: Payer: Self-pay | Attending: Nurse Practitioner | Admitting: Nurse Practitioner

## 2018-03-16 ENCOUNTER — Encounter: Payer: Self-pay | Admitting: Nurse Practitioner

## 2018-03-16 VITALS — BP 125/83 | HR 87 | Temp 98.8°F | Ht 63.0 in | Wt 253.8 lb

## 2018-03-16 DIAGNOSIS — J45909 Unspecified asthma, uncomplicated: Secondary | ICD-10-CM | POA: Insufficient documentation

## 2018-03-16 DIAGNOSIS — E1165 Type 2 diabetes mellitus with hyperglycemia: Secondary | ICD-10-CM | POA: Insufficient documentation

## 2018-03-16 DIAGNOSIS — Z7982 Long term (current) use of aspirin: Secondary | ICD-10-CM | POA: Insufficient documentation

## 2018-03-16 DIAGNOSIS — Z79899 Other long term (current) drug therapy: Secondary | ICD-10-CM | POA: Insufficient documentation

## 2018-03-16 DIAGNOSIS — Z6841 Body Mass Index (BMI) 40.0 and over, adult: Secondary | ICD-10-CM | POA: Insufficient documentation

## 2018-03-16 DIAGNOSIS — Z23 Encounter for immunization: Secondary | ICD-10-CM | POA: Insufficient documentation

## 2018-03-16 DIAGNOSIS — R7989 Other specified abnormal findings of blood chemistry: Secondary | ICD-10-CM | POA: Insufficient documentation

## 2018-03-16 DIAGNOSIS — M1990 Unspecified osteoarthritis, unspecified site: Secondary | ICD-10-CM | POA: Insufficient documentation

## 2018-03-16 DIAGNOSIS — Z7984 Long term (current) use of oral hypoglycemic drugs: Secondary | ICD-10-CM | POA: Insufficient documentation

## 2018-03-16 DIAGNOSIS — E785 Hyperlipidemia, unspecified: Secondary | ICD-10-CM | POA: Insufficient documentation

## 2018-03-16 DIAGNOSIS — I10 Essential (primary) hypertension: Secondary | ICD-10-CM | POA: Insufficient documentation

## 2018-03-16 DIAGNOSIS — E669 Obesity, unspecified: Secondary | ICD-10-CM | POA: Insufficient documentation

## 2018-03-16 LAB — POCT GLYCOSYLATED HEMOGLOBIN (HGB A1C): Hemoglobin A1C: 6.1 % — AB (ref 4.0–5.6)

## 2018-03-16 LAB — GLUCOSE, POCT (MANUAL RESULT ENTRY): POC GLUCOSE: 101 mg/dL — AB (ref 70–99)

## 2018-03-16 NOTE — Patient Instructions (Signed)
Preventing Unhealthy Weight Gain, Adult Staying at a healthy weight is important. When fat builds up in your body, you may become overweight or obese. These conditions put you at greater risk for developing certain health problems, such as heart disease, diabetes, sleeping problems, joint problems, and some cancers. Unhealthy weight gain is often the result of making unhealthy choices in what you eat. It is also a result of not getting enough exercise. You can make changes to your lifestyle to prevent obesity and stay as healthy as possible. What nutrition changes can be made? To maintain a healthy weight and prevent obesity:  Eat only as much as your body needs. To do this: ? Pay attention to signs that you are hungry or full. Stop eating as soon as you feel full. ? If you feel hungry, try drinking water first. Drink enough water so your urine is clear or pale yellow. ? Eat smaller portions. ? Look at serving sizes on food labels. Most foods contain more than one serving per container. ? Eat the recommended amount of calories for your gender and activity level. While most active people should eat around 2,000 calories per day, if you are trying to lose weight or are not very active, you main need to eat less calories. Talk to your health care provider or dietitian about how many calories you should eat each day.  Choose healthy foods, such as: ? Fruits and vegetables. Try to fill at least half of your plate at each meal with fruits and vegetables. ? Whole grains, such as whole wheat bread, brown rice, and quinoa. ? Lean meats, such as chicken or fish. ? Other healthy proteins, such as beans, eggs, or tofu. ? Healthy fats, such as nuts, seeds, fatty fish, and olive oil. ? Low-fat or fat-free dairy.  Check food labels and avoid food and drinks that: ? Are high in calories. ? Have added sugar. ? Are high in sodium. ? Have saturated fats or trans fats.  Limit how much you eat of the following  foods: ? Prepackaged meals. ? Fast food. ? Fried foods. ? Processed meat, such as bacon, sausage, and deli meats. ? Fatty cuts of red meat and poultry with skin.  Cook foods in healthier ways, such as by baking, broiling, or grilling.  When grocery shopping, try to shop around the outside of the store. This helps you buy mostly fresh foods and avoid canned and prepackaged foods.  What lifestyle changes can be made?  Exercise at least 30 minutes 5 or more days each week. Exercising includes brisk walking, yard work, biking, running, swimming, and team sports like basketball and soccer. Ask your health care provider which exercises are safe for you.  Do not use any products that contain nicotine or tobacco, such as cigarettes and e-cigarettes. If you need help quitting, ask your health care provider.  Limit alcohol intake to no more than 1 drink a day for nonpregnant women and 2 drinks a day for men. One drink equals 12 oz of beer, 5 oz of wine, or 1 oz of hard liquor.  Try to get 7-9 hours of sleep each night. What other changes can be made?  Keep a food and activity journal to keep track of: ? What you ate and how many calories you had. Remember to count sauces, dressings, and side dishes. ? Whether you were active, and what exercises you did. ? Your calorie, weight, and activity goals.  Check your weight regularly. Track any changes.   If you notice you have gained weight, make changes to your diet or activity routine.  Avoid taking weight-loss medicines or supplements. Talk to your health care provider before starting any new medicine or supplement.  Talk to your health care provider before trying any new diet or exercise plan. Why are these changes important? Eating healthy, staying active, and having healthy habits not only help prevent obesity, they also:  Help you to manage stress and emotions.  Help you to connect with friends and family.  Improve your  self-esteem.  Improve your sleep.  Prevent long-term health problems.  What can happen if changes are not made? Being obese or overweight can cause you to develop joint or bone problems, which can make it hard for you to stay active or do activities you enjoy. Being obese or overweight also puts stress on your heart and lungs and can lead to health problems like diabetes, heart disease, and some cancers. Where to find more information: Talk with your health care provider or a dietitian about healthy eating and healthy lifestyle choices. You may also find other information through these resources:  U.[place]. Department of Agriculture MyPlate: www.choosemyplate.gov  American Heart Association: www.heart.org  Centers for Disease Control and Prevention: www.cdc.gov  Summary  Staying at a healthy weight is important. It helps prevent certain diseases and health problems, such as heart disease, diabetes, joint problems, sleep disorders, and some cancers.  Being obese or overweight can cause you to develop joint or bone problems, which can make it hard for you to stay active or do activities you enjoy.  You can prevent unhealthy weight gain by eating a healthy diet, exercising regularly, not smoking, limiting alcohol, and getting enough sleep.  Talk with your health care provider or a dietitian for guidance about healthy eating and healthy lifestyle choices. This information is not intended to replace advice given to you by your health care provider. Make sure you discuss any questions you have with your health care provider. Document Released: 05/17/2016 Document Revised: 06/22/2016 Document Reviewed: 06/22/2016 Elsevier Interactive Patient Education  2018 [employer]  

## 2018-03-16 NOTE — Progress Notes (Signed)
Assessment & Plan:  Desiree Hernandez was seen today for follow-up.  Diagnoses and all orders for this visit:  Type 2 diabetes mellitus with hyperglycemia, without long-term current use of insulin (HCC) -     Glucose (CBG) -     HgB A1c Controlled Continue medications as prescribed.  Continue blood sugar control as discussed in office today, low carbohydrate diet, and regular physical exercise as tolerated, 150 minutes per week (30 min each day, 5 days per week, or 50 min 3 days per week). Keep blood sugar logs with fasting goal of 90-130 mg/dl, post prandial (after you eat) less than 180.  For Hypoglycemia: BS <60 and Hyperglycemia BS >400; contact the clinic ASAP. Annual eye exams and foot exams are recommended.  Elevated serum creatinine -     CMP14+EGFR  Need for immunization against influenza -     Flu Vaccine QUAD 36+ mos IM    Patient has been counseled on age-appropriate routine health concerns for screening and prevention. These are reviewed and up-to-date. Referrals have been placed accordingly. Immunizations are up-to-date or declined.    Subjective:   Chief Complaint  Patient presents with  . Follow-up    Pt. is here for follow-up on diabetes.    HPI Desiree Hernandez 47 y.o. female presents to office today for follow up to DM.   Diabetes Mellitus Type 2 Well controlled. Improving. A1c down from 6.3 to 6.1. She denies any hypo or hyperglycemic symptoms. She endorses medication compliance taking janumet 50-1000 mg BID and glimepiride 4 mg daily. Weight is stable. She is overdue for eye exam. Patient has been advised to apply for financial assistance and schedule to see our financial counselor. Monitoring blood glucose levels daily with average readings less than 130.  Blood pressure is well controlled. She is taking lisinopril 10m for renal protection.  Lab Results  Component Value Date   HGBA1C 6.1 (A) 03/16/2018   Depression  Symptoms well controlled on zoloft 50 mg daily.  PHQ 9 score improved. She denies any suicidal ideation and does endorse improved mood.  [facility]/16/2018 02/01/2018 01/17/2018 12/13/2017 11/06/2017  Decreased Interest 2 3 1 1 1   Down, Depressed, Hopeless 2 3 1 2  0  PHQ - 2 Score 4 6 2 3 1   Altered sleeping 3 3 2 1  0  Tired, decreased energy 1 3 3 1 3   Change in appetite 0 3 1 1  0  Feeling bad or failure about yourself  0 3 1 2 1   Trouble concentrating 3 3 0 3 0  Moving slowly or fidgety/restless 1 2 0 2 0  Suicidal thoughts 0 0 0 0 0  PHQ-9 Score 12 23 9 13 5    Review of Systems  Constitutional: Negative for fever, malaise/fatigue and weight loss.  HENT: Negative.  Negative for nosebleeds.   Eyes: Negative.  Negative for blurred vision, double vision and photophobia.  Respiratory: Negative.  Negative for cough and shortness of breath.   Cardiovascular: Negative.  Negative for chest pain, palpitations and leg swelling.  Gastrointestinal: Negative.  Negative for heartburn, nausea and vomiting.  Musculoskeletal: Negative.  Negative for myalgias.  Neurological: Negative.  Negative for dizziness, focal weakness, seizures and headaches.  Psychiatric/Behavioral: Negative.  Negative for suicidal ideas.    Past Medical History:  Diagnosis Date  . Arthritis   . Asthma   . Diabetes mellitus without complication ([place]   . Hyperlipidemia   . Hypertension   . Obesity  Past Surgical History:  Procedure Laterality Date  . ANAL FISSURE REPAIR    . TUBAL LIGATION      Family History  Problem Relation Age of Onset  . Heart disease Mother   . Heart disease Father   . Alzheimer's disease Father   . Diabetes Father   . Diabetes Sister   . Diabetes Paternal Aunt   . Diabetes Paternal Uncle     Social History Reviewed with no changes to be made today.   Outpatient Medications Prior to Visit  Medication Sig Dispense Refill  . Acetaminophen (TYLENOL ARTHRITIS PAIN PO) Take 625 mg by mouth 2 (two) times daily as  needed.    Marland Kitchen aspirin EC 81 MG tablet Take 1 tablet (81 mg total) by mouth daily. 150 tablet 1  . atorvastatin (LIPITOR) 20 MG tablet Take 1 tablet (20 mg total) by mouth daily. 90 tablet 3  . Blood Glucose Monitoring Suppl (TRUE METRIX METER) DEVI 1 kit by Does not apply route 4 (four) times daily. 1 Device 0  . Cyanocobalamin (VITAMIN B-12 PO) Take by mouth.    . fenofibrate (TRICOR) 48 MG tablet Take 1 tablet (48 mg total) by mouth daily. 90 tablet 1  . glimepiride (AMARYL) 4 MG tablet Take 1 tablet (4 mg total) by mouth daily before breakfast. 90 tablet 1  . glucose blood (TRUE METRIX BLOOD GLUCOSE TEST) test strip Use as instructed 100 each 12  . lisinopril (PRINIVIL,ZESTRIL) 5 MG tablet Take 1 tablet (5 mg total) by mouth daily. 90 tablet 3  . methocarbamol (ROBAXIN) 750 MG tablet Take 1 tablet (750 mg total) by mouth 3 (three) times daily. X 1 week then prn muscle spasm 60 tablet 1  . sertraline (ZOLOFT) 50 MG tablet Take 1 tablet (50 mg total) by mouth daily. 30 tablet 3  . sitaGLIPtin-metformin (JANUMET) 50-1000 MG tablet Take 1 tablet by mouth 2 (two) times daily with a meal. 60 tablet 3  . TRUEPLUS LANCETS 28G MISC 28 g by Does not apply route 4 (four) times daily. 120 each 3  . naproxen (NAPROSYN) 500 MG tablet Take 1 tablet (500 mg total) by mouth 2 (two) times daily with a meal. X 7 days then prn pain (Patient not taking: Reported on 01/17/2018) 60 tablet 0  . Vitamin D, Ergocalciferol, (DRISDOL) 50000 units CAPS capsule Take 1 capsule (50,000 Units total) by mouth every 7 (seven) days. (Patient not taking: Reported on 03/16/2018) 16 capsule 0   No facility-administered medications prior to visit.     Allergies  Allergen Reactions  . Penicillins Rash    Childhood allergy Has patient had a PCN reaction causing immediate rash, facial/tongue/throat swelling, SOB or lightheadedness with hypotension: Yes Has patient had a PCN reaction causing severe rash involving mucus membranes or  skin necrosis: No Has patient had a PCN reaction that required hospitalization No Has patient had a PCN reaction occurring within the last 10 years: No If all of the above answers are "NO", then may proceed with Cephalosporin use.        Objective:    BP 125/83 (BP Location: Left Arm, Patient Position: Sitting, Cuff Size: Normal)   Pulse 87   Temp 98.8 F (37.1 C) (Oral)   Ht 5' 3"  (1.6 m)   Wt 253 lb 12.8 oz (115.1 kg)   LMP 02/09/2018   SpO2 96%   BMI 44.96 kg/m  Wt Readings from Last 3 Encounters:  03/16/18 253 lb 12.8 oz (115.1 kg)  02/15/18 245 lb 12.8 oz (111.5 kg)  02/01/18 251 lb (113.9 kg)    Physical Exam  Constitutional: She is oriented to person, place, and time. She appears well-developed and well-nourished. She is cooperative.  HENT:  Head: Normocephalic and atraumatic.  Eyes: EOM are normal.  Neck: Normal range of motion.  Cardiovascular: Normal rate, regular rhythm, normal heart sounds and intact distal pulses. Exam reveals no gallop and no friction rub.  No murmur heard. Pulses:      Dorsalis pedis pulses are 2+ on the right side, and 2+ on the left side.       Posterior tibial pulses are 2+ on the right side, and 2+ on the left side.  Pulmonary/Chest: Effort normal and breath sounds normal. No tachypnea. No respiratory distress. She has no decreased breath sounds. She has no wheezes. She has no rhonchi. She has no rales. She exhibits no tenderness.  Abdominal: Bowel sounds are normal.  Musculoskeletal: Normal range of motion. She exhibits no edema.  Feet:  Right Foot:  Protective Sensation: 10 sites tested. 10 sites sensed.  Skin Integrity: Negative for skin breakdown, callus or dry skin.  Left Foot:  Protective Sensation: 10 sites tested. 10 sites sensed.  Skin Integrity: Negative for skin breakdown, callus or dry skin.  Neurological: She is alert and oriented to person, place, and time. Coordination normal.  Skin: Skin is warm and dry.    Psychiatric: She has a normal mood and affect. Her speech is normal and behavior is normal. Judgment and thought content normal. Cognition and memory are normal.  Nursing note and vitals reviewed.        Patient has been counseled extensively about nutrition and exercise as well as the importance of adherence with medications and regular follow-up. The patient was given clear instructions to go to ER or return to medical center if symptoms don't improve, worsen or new problems develop. The patient verbalized understanding.   Follow-up: Return in about 3 months (around 06/16/2018) for DM.   [name], FNP-BC [facility] and [place], [place]   03/18/2018, 7:23 PM

## 2018-03-17 LAB — CMP14+EGFR
A/G RATIO: 0.7 — AB (ref 1.2–2.2)
ALK PHOS: 33 IU/L — AB (ref 39–117)
ALT: 8 IU/L (ref 0–32)
AST: 9 IU/L (ref 0–40)
Albumin: 3.7 g/dL (ref 3.5–5.5)
BUN/Creatinine Ratio: 11 (ref 9–23)
BUN: 16 mg/dL (ref 6–24)
Bilirubin Total: <0.2 mg/dL (ref 0.0–1.2)
CALCIUM: 9.3 mg/dL (ref 8.7–10.2)
CHLORIDE: 95 mmol/L — AB (ref 96–106)
CO2: 22 mmol/L (ref 20–29)
Creatinine, Ser: 1.42 mg/dL — ABNORMAL HIGH (ref 0.57–1.00)
GFR calc Af Amer: 51 mL/min/{1.73_m2} — ABNORMAL LOW (ref >59)
GFR, EST NON AFRICAN AMERICAN: 44 mL/min/{1.73_m2} — AB (ref >59)
Globulin, Total: 5.5 g/dL — ABNORMAL HIGH (ref 1.5–4.5)
Glucose: 69 mg/dL (ref 65–99)
Potassium: 4.7 mmol/L (ref 3.5–5.2)
Sodium: 132 mmol/L — ABNORMAL LOW (ref 134–144)
Total Protein: 9.2 g/dL — ABNORMAL HIGH (ref 6.0–8.5)

## 2018-03-18 ENCOUNTER — Encounter: Payer: Self-pay | Admitting: Nurse Practitioner

## 2018-03-18 ENCOUNTER — Other Ambulatory Visit: Payer: Self-pay | Admitting: Nurse Practitioner

## 2018-03-18 DIAGNOSIS — R7989 Other specified abnormal findings of blood chemistry: Secondary | ICD-10-CM

## 2018-03-19 ENCOUNTER — Telehealth: Payer: Self-pay

## 2018-03-19 NOTE — Telephone Encounter (Signed)
CMA attempt to reach patient to inform on results.  No answer and left a VM for patient to call back.  

## 2018-03-19 NOTE — Telephone Encounter (Signed)
-----   Message from [name], NP sent at 03/18/2018  7:44 PM EDT ----- Kidney function has slightly worsened. Are you taking the lisinopril 5mg  every day?  Are you taking an OTC anti inflammatories such as aleve, advil, motrin or ibuprofen? This will help me determine what needs to be stopped.

## 2018-03-29 ENCOUNTER — Other Ambulatory Visit: Payer: Self-pay | Admitting: Nurse Practitioner

## 2018-03-29 DIAGNOSIS — E1165 Type 2 diabetes mellitus with hyperglycemia: Secondary | ICD-10-CM

## 2018-03-29 MED FILL — SERTRALINE HCL 50 MG TABS: 50 | 30 days supply | Qty: 30 | Fill #2

## 2018-03-30 MED FILL — ?GLIMEPIRIDE 4 MG TABLET: 4 | 30 days supply | Qty: 30 | Fill #0

## 2018-04-02 ENCOUNTER — Other Ambulatory Visit: Payer: Medicaid Other

## 2018-04-03 ENCOUNTER — Other Ambulatory Visit: Payer: Self-pay

## 2018-04-03 ENCOUNTER — Encounter (HOSPITAL_COMMUNITY): Payer: Self-pay | Admitting: *Deleted

## 2018-04-03 ENCOUNTER — Ambulatory Visit: Payer: Self-pay | Attending: Nurse Practitioner

## 2018-04-03 DIAGNOSIS — R7989 Other specified abnormal findings of blood chemistry: Secondary | ICD-10-CM

## 2018-04-03 NOTE — Progress Notes (Signed)
Letter mailed to patient with negative pap smear results. HPV was negative. Next pap smear due in five years. 

## 2018-04-04 LAB — CMP14+EGFR
ALK PHOS: 31 IU/L — AB (ref 39–117)
ALT: 7 IU/L (ref 0–32)
AST: 9 IU/L (ref 0–40)
Albumin/Globulin Ratio: 0.7 — ABNORMAL LOW (ref 1.2–2.2)
Albumin: 3.8 g/dL (ref 3.5–5.5)
BUN/Creatinine Ratio: 9 (ref 9–23)
BUN: 14 mg/dL (ref 6–24)
Bilirubin Total: <0.2 mg/dL (ref 0.0–1.2)
CO2: 22 mmol/L (ref 20–29)
CREATININE: 1.52 mg/dL — AB (ref 0.57–1.00)
Calcium: 9 mg/dL (ref 8.7–10.2)
Chloride: 100 mmol/L (ref 96–106)
GFR calc Af Amer: 47 mL/min/{1.73_m2} — ABNORMAL LOW (ref >59)
GFR calc non Af Amer: 41 mL/min/{1.73_m2} — ABNORMAL LOW (ref >59)
GLOBULIN, TOTAL: 5.5 g/dL — AB (ref 1.5–4.5)
GLUCOSE: 104 mg/dL — AB (ref 65–99)
Potassium: 4.6 mmol/L (ref 3.5–5.2)
Sodium: 138 mmol/L (ref 134–144)
Total Protein: 9.3 g/dL — ABNORMAL HIGH (ref 6.0–8.5)

## 2018-04-11 MED FILL — TRUE METRIX TEST STRIP: 25 days supply | Qty: 100 | Fill #3

## 2018-04-11 MED FILL — TRUEplus LANCETS 28G MISC: 25 days supply | Qty: 100 | Fill #3

## 2018-04-11 MED FILL — FENOFIBRATE 48 MG TABLET: 48 | 30 days supply | Qty: 30 | Fill #3

## 2018-04-11 MED FILL — ?ATORVASTATIN 20 MG TABLET: 20 | 30 days supply | Qty: 30 | Fill #5

## 2018-04-23 ENCOUNTER — Ambulatory Visit: Payer: Medicaid Other | Attending: Nurse Practitioner

## 2018-04-23 DIAGNOSIS — I10 Essential (primary) hypertension: Secondary | ICD-10-CM

## 2018-04-23 DIAGNOSIS — R7989 Other specified abnormal findings of blood chemistry: Secondary | ICD-10-CM

## 2018-04-24 LAB — CMP14+EGFR
ALT: 7 IU/L (ref 0–32)
AST: 7 IU/L (ref 0–40)
Albumin/Globulin Ratio: 0.7 — ABNORMAL LOW (ref 1.2–2.2)
Albumin: 3.9 g/dL (ref 3.5–5.5)
Alkaline Phosphatase: 31 IU/L — ABNORMAL LOW (ref 39–117)
BUN/Creatinine Ratio: 9 (ref 9–23)
BUN: 12 mg/dL (ref 6–24)
CALCIUM: 9.2 mg/dL (ref 8.7–10.2)
CHLORIDE: 96 mmol/L (ref 96–106)
CO2: 23 mmol/L (ref 20–29)
Creatinine, Ser: 1.38 mg/dL — ABNORMAL HIGH (ref 0.57–1.00)
GFR, EST AFRICAN AMERICAN: 53 mL/min/{1.73_m2} — AB (ref >59)
GFR, EST NON AFRICAN AMERICAN: 46 mL/min/{1.73_m2} — AB (ref >59)
GLOBULIN, TOTAL: 5.8 g/dL — AB (ref 1.5–4.5)
Glucose: 110 mg/dL — ABNORMAL HIGH (ref 65–99)
Potassium: 4.3 mmol/L (ref 3.5–5.2)
SODIUM: 134 mmol/L (ref 134–144)
Total Protein: 9.7 g/dL — ABNORMAL HIGH (ref 6.0–8.5)

## 2018-05-01 MED FILL — ?GLIMEPIRIDE 4 MG TABLET: 4 | 30 days supply | Qty: 30 | Fill #1

## 2018-05-01 MED FILL — SERTRALINE HCL 50 MG TABS: 50 | 30 days supply | Qty: 30 | Fill #3

## 2018-05-03 ENCOUNTER — Encounter: Payer: Self-pay | Admitting: Nurse Practitioner

## 2018-05-04 ENCOUNTER — Encounter: Payer: Self-pay | Admitting: Nurse Practitioner

## 2018-05-04 NOTE — Telephone Encounter (Signed)
Mchart message

## 2018-05-10 MED FILL — FENOFIBRATE 48 MG TABLET: 48 | 30 days supply | Qty: 30 | Fill #4

## 2018-05-10 MED FILL — ?ATORVASTATIN 20 MG TABLET: 20 | 30 days supply | Qty: 30 | Fill #6

## 2018-05-14 ENCOUNTER — Other Ambulatory Visit: Payer: Self-pay | Admitting: Nurse Practitioner

## 2018-05-14 DIAGNOSIS — R29898 Other symptoms and signs involving the musculoskeletal system: Secondary | ICD-10-CM

## 2018-05-17 ENCOUNTER — Other Ambulatory Visit: Payer: Self-pay | Admitting: Nurse Practitioner

## 2018-05-17 DIAGNOSIS — R29898 Other symptoms and signs involving the musculoskeletal system: Secondary | ICD-10-CM

## 2018-05-24 ENCOUNTER — Other Ambulatory Visit: Payer: Self-pay | Admitting: Physician Assistant

## 2018-05-24 ENCOUNTER — Encounter: Payer: Self-pay | Admitting: Nurse Practitioner

## 2018-05-24 MED FILL — ?GLIMEPIRIDE 4 MG TABLET: 4 | 30 days supply | Qty: 30 | Fill #2

## 2018-05-28 MED FILL — SERTRALINE HCL 50 MG TABS: 50 | 30 days supply | Qty: 30 | Fill #0

## 2018-06-14 MED FILL — ?ATORVASTATIN 20 MG TABLET: 20 | 30 days supply | Qty: 30 | Fill #7

## 2018-06-14 MED FILL — FENOFIBRATE 48 MG TABLET: 48 | 30 days supply | Qty: 30 | Fill #5

## 2018-06-18 ENCOUNTER — Encounter: Payer: Self-pay | Admitting: Nurse Practitioner

## 2018-06-18 ENCOUNTER — Ambulatory Visit: Payer: Medicaid Other | Attending: Nurse Practitioner | Admitting: Nurse Practitioner

## 2018-06-18 VITALS — BP 138/83 | HR 93 | Temp 98.1°F | Resp 16 | Ht 63.0 in | Wt 259.4 lb

## 2018-06-18 DIAGNOSIS — E785 Hyperlipidemia, unspecified: Secondary | ICD-10-CM | POA: Insufficient documentation

## 2018-06-18 DIAGNOSIS — M545 Low back pain: Secondary | ICD-10-CM | POA: Insufficient documentation

## 2018-06-18 DIAGNOSIS — E1165 Type 2 diabetes mellitus with hyperglycemia: Secondary | ICD-10-CM

## 2018-06-18 DIAGNOSIS — G8929 Other chronic pain: Secondary | ICD-10-CM | POA: Diagnosis not present

## 2018-06-18 DIAGNOSIS — M1990 Unspecified osteoarthritis, unspecified site: Secondary | ICD-10-CM | POA: Insufficient documentation

## 2018-06-18 DIAGNOSIS — Z79899 Other long term (current) drug therapy: Secondary | ICD-10-CM | POA: Insufficient documentation

## 2018-06-18 DIAGNOSIS — Z8249 Family history of ischemic heart disease and other diseases of the circulatory system: Secondary | ICD-10-CM | POA: Insufficient documentation

## 2018-06-18 DIAGNOSIS — I10 Essential (primary) hypertension: Secondary | ICD-10-CM | POA: Insufficient documentation

## 2018-06-18 DIAGNOSIS — Z7982 Long term (current) use of aspirin: Secondary | ICD-10-CM | POA: Insufficient documentation

## 2018-06-18 DIAGNOSIS — F5101 Primary insomnia: Secondary | ICD-10-CM | POA: Insufficient documentation

## 2018-06-18 DIAGNOSIS — Z7901 Long term (current) use of anticoagulants: Secondary | ICD-10-CM | POA: Insufficient documentation

## 2018-06-18 DIAGNOSIS — Z7984 Long term (current) use of oral hypoglycemic drugs: Secondary | ICD-10-CM | POA: Insufficient documentation

## 2018-06-18 DIAGNOSIS — N179 Acute kidney failure, unspecified: Secondary | ICD-10-CM | POA: Insufficient documentation

## 2018-06-18 DIAGNOSIS — R21 Rash and other nonspecific skin eruption: Secondary | ICD-10-CM | POA: Diagnosis not present

## 2018-06-18 LAB — GLUCOSE, POCT (MANUAL RESULT ENTRY): POC GLUCOSE: 142 mg/dL — AB (ref 70–99)

## 2018-06-18 MED ORDER — MUPIROCIN 2 % EX OINT: TOPICAL_OINTMENT | CUTANEOUS | 0 refills | Status: DC

## 2018-06-18 MED ORDER — ACETAMINOPHEN-CODEINE #3 300-30 MG PO TABS: 1.0000 | ORAL_TABLET | Freq: Four times a day (QID) | ORAL | 0 refills | Status: DC | PRN

## 2018-06-18 MED ORDER — TRAZODONE HCL 100 MG PO TABS: 100.0000 mg | ORAL_TABLET | Freq: Every evening | ORAL | 1 refills | Status: AC | PRN

## 2018-06-18 MED FILL — ACETAMINOPHEN/COD #3 TABLET: 300-30 | 15 days supply | Qty: 60 | Fill #0

## 2018-06-18 MED FILL — MUPIROCIN 2% OINTMENT: 2 | 10 days supply | Qty: 22 | Fill #0

## 2018-06-18 MED FILL — traZODone HCL 100 MG TABS: 100 | 30 days supply | Qty: 30 | Fill #0

## 2018-06-18 NOTE — Progress Notes (Signed)
Assessment & Plan:  Desiree Hernandez was seen today for diabetes and hypertension.  Diagnoses and all orders for this visit:  Type 2 diabetes mellitus with hyperglycemia, without long-term current use of insulin (HCC) -     POCT glucose (manual entry) Continue blood sugar control as discussed in office today, low carbohydrate diet, and regular physical exercise as tolerated, 150 minutes per week (30 min each day, 5 days per week, or 50 min 3 days per week). Keep blood sugar logs with fasting goal of 90-130 mg/dl, post prandial (after you eat) less than 180.  For Hypoglycemia: BS <60 and Hyperglycemia BS >400; contact the clinic ASAP. Annual eye exams and foot exams are recommended.   Chronic bilateral low back pain without sciatica -     acetaminophen-codeine (TYLENOL #3) 300-30 MG tablet; Take 1 tablet by mouth every 6 (six) hours as needed for up to 30 days for moderate pain. Work on losing weight to help reduce back pain. May alternate with heat and ice application for pain relief. Other alternatives include massage, acupuncture and water aerobics.  You must stay active and avoid a sedentary lifestyle. Unable to take NSAIDs due to history of AKI   Skin rash -     mupirocin ointment (BACTROBAN) 2 %; Apply to affected areas 2 (two) times per day.  Primary insomnia -     traZODone (DESYREL) 100 MG tablet; Take 1 tablet (100 mg total) by mouth at bedtime as needed for up to 30 days for sleep.    Patient has been counseled on age-appropriate routine health concerns for screening and prevention. These are reviewed and up-to-date. Referrals have been placed accordingly. Immunizations are up-to-date or declined.    Subjective:   Chief Complaint  Patient presents with  . Diabetes  . Hypertension   HPI Desiree Hernandez 48 y.o. female presents to office today for follow up to DM and HTN.   DM Chronic. A1c is not due today. She is monitoring her blood glucose levels once a day fasting with  averages 120-140s. I have instructed her to start monitoring her blood glucose levels postprandial as well. She currently denies any hypo or hyperglycemic symptoms. Her weight is increasing. I have urged her to focus on weight reduction in order to reduce diabetic complications as well as her back pain. She is overdue for eye exam.  Patient has been advised to apply for financial assistance and schedule to see our financial counselor.  Lab Results  Component Value Date   HGBA1C 6.1 (A) 03/16/2018     Back Pain Reports worsening pain in her lower back and hips. Her weight is up as well which I have instructed her may be contributing to her pain. She is taking zoloft for depression which is improved however this has not helped to lessen the intensity of her pain. Will add tylenol #3. I have instructed her to take this sparingly to last for 2 months and the goal will not be long term opioids. She has an appt with Desiree Hernandez in March. There was some discussion regarding injections at her last office visit with him. Aggravating factors: prolonged sitting. Relieving factors: standing, relieving pressure. There is no associated sciatica. She does use a walker for mobility.   Skin Rash She has chronic erythematous macular scattered skin lesions on the lower back and left calf. Endorses intense pruritus which causes her to scratch and the lesions to become excoriated. She has not tried any medications for relief.  Lesions have been present for months.   Insomnia Endorses difficulty staying asleep and falling asleep. She has not tried any OTC medications for this. I will try her on Trazodone as she has a history of depression as well.    Review of Systems  Constitutional: Negative for fever, malaise/fatigue and weight loss.  HENT: Negative.  Negative for nosebleeds.   Eyes: Negative.  Negative for blurred vision, double vision and photophobia.  Respiratory: Negative.  Negative for cough and shortness  of breath.   Cardiovascular: Negative.  Negative for chest pain, palpitations and leg swelling.  Gastrointestinal: Negative.  Negative for heartburn, nausea and vomiting.  Musculoskeletal: Positive for back pain. Negative for myalgias.  Skin: Positive for itching and rash.  Neurological: Negative.  Negative for dizziness, focal weakness, seizures and headaches.  Psychiatric/Behavioral: Positive for depression. Negative for suicidal ideas. The patient has insomnia.     Past Medical History:  Diagnosis Date  . Arthritis   . Asthma   . Diabetes mellitus without complication ([place])   . Hyperlipidemia   . Hypertension   . Obesity     Past Surgical History:  Procedure Laterality Date  . ANAL FISSURE REPAIR    . TUBAL LIGATION      Family History  Problem Relation Age of Onset  . Heart disease Mother   . Heart disease Father   . Alzheimer's disease Father   . Diabetes Father   . Diabetes Sister   . Diabetes Paternal Aunt   . Diabetes Paternal Uncle     Social History Reviewed with no changes to be made today.   Outpatient Medications Prior to Visit  Medication Sig Dispense Refill  . Acetaminophen (TYLENOL ARTHRITIS PAIN PO) Take 625 mg by mouth 2 (two) times daily as needed.    Marland Kitchen aspirin EC 81 MG tablet Take 1 tablet (81 mg total) by mouth daily. 150 tablet 1  . atorvastatin (LIPITOR) 20 MG tablet Take 1 tablet (20 mg total) by mouth daily. 90 tablet 3  . Blood Glucose Monitoring Suppl (TRUE METRIX METER) DEVI 1 kit by Does not apply route 4 (four) times daily. 1 Device 0  . Cyanocobalamin (VITAMIN B-12 PO) Take by mouth.    . fenofibrate (TRICOR) 48 MG tablet Take 1 tablet (48 mg total) by mouth daily. 90 tablet 1  . glimepiride (AMARYL) 4 MG tablet TAKE 1 TABLET (4 MG TOTAL) BY MOUTH DAILY BEFORE BREAKFAST. 30 tablet 2  . glucose blood (TRUE METRIX BLOOD GLUCOSE TEST) test strip Use as instructed 100 each 12  . lisinopril (PRINIVIL,ZESTRIL) 5 MG tablet Take 1 tablet (5 mg  total) by mouth daily. 90 tablet 3  . methocarbamol (ROBAXIN) 750 MG tablet Take 1 tablet (750 mg total) by mouth 3 (three) times daily. X 1 week then prn muscle spasm 60 tablet 1  . sertraline (ZOLOFT) 50 MG tablet TAKE 1 TABLET (50 MG TOTAL) BY MOUTH DAILY. 30 tablet 2  . sitaGLIPtin-metformin (JANUMET) 50-1000 MG tablet Take 1 tablet by mouth 2 (two) times daily with a meal. 60 tablet 3  . TRUEPLUS LANCETS 28G MISC 28 g by Does not apply route 4 (four) times daily. 120 each 3  . Vitamin D, Ergocalciferol, (DRISDOL) 50000 units CAPS capsule Take 1 capsule (50,000 Units total) by mouth every 7 (seven) days. (Patient not taking: Reported on 03/16/2018) 16 capsule 0  . naproxen (NAPROSYN) 500 MG tablet Take 1 tablet (500 mg total) by mouth 2 (two) times daily with a  meal. X 7 days then prn pain (Patient not taking: Reported on 01/17/2018) 60 tablet 0   No facility-administered medications prior to visit.     Allergies  Allergen Reactions  . Nsaids     AKI  . Penicillins Rash    Childhood allergy Has patient had a PCN reaction causing immediate rash, facial/tongue/throat swelling, SOB or lightheadedness with hypotension: Yes Has patient had a PCN reaction causing severe rash involving mucus membranes or skin necrosis: No Has patient had a PCN reaction that required hospitalization No Has patient had a PCN reaction occurring within the last 10 years: No If all of the above answers are "NO", then may proceed with Cephalosporin use.        Objective:    BP 138/83   Pulse 93   Temp 98.1 F (36.7 C) (Oral)   Resp 16   Ht _0  (1.6 m)   Wt 259 lb 6.4 oz (117.7 kg)   SpO2 97%   BMI 45.95 kg/m  Wt Readings from Last 3 Encounters:  06/18/18 259 lb 6.4 oz (117.7 kg)  03/16/18 253 lb 12.8 oz (115.1 kg)  02/15/18 245 lb 12.8 oz (111.5 kg)    Physical Exam Vitals signs and nursing note reviewed.  Constitutional:      Appearance: She is well-developed.  HENT:     Head: Normocephalic  and atraumatic.  Neck:     Musculoskeletal: Normal range of motion.  Cardiovascular:     Rate and Rhythm: Normal rate and regular rhythm.     Heart sounds: Normal heart sounds. No murmur. No friction rub. No gallop.   Pulmonary:     Effort: Pulmonary effort is normal. No tachypnea or respiratory distress.     Breath sounds: Normal breath sounds. No decreased breath sounds, wheezing, rhonchi or rales.  Chest:     Chest wall: Tenderness present. No mass.    Abdominal:     General: Bowel sounds are normal.     Palpations: Abdomen is soft.  Musculoskeletal: Normal range of motion.       Back:  Skin:    General: Skin is warm and dry.     Findings: Erythema, lesion and rash present. Rash is crusting and macular.  Neurological:     Mental Status: She is alert and oriented to person, place, and time.     Coordination: Coordination normal.  Psychiatric:        Behavior: Behavior normal. Behavior is cooperative.        Thought Content: Thought content normal.        Judgment: Judgment normal.          Patient has been counseled extensively about nutrition and exercise as well as the importance of adherence with medications and regular follow-up. The patient was given clear instructions to go to ER or return to medical center if symptoms don't improve, worsen or new problems develop. The patient verbalized understanding.   Follow-up: Return in about 3 weeks (around 07/09/2018) for Skin rash, back pain.   [name], FNP-BC [facility] and [facility] [place], [place]   06/18/2018, 12:05 PM

## 2018-06-20 ENCOUNTER — Encounter: Payer: Self-pay | Admitting: Nurse Practitioner

## 2018-06-22 ENCOUNTER — Encounter: Payer: Self-pay | Admitting: Nurse Practitioner

## 2018-06-22 NOTE — Telephone Encounter (Signed)
Patient mychart concern and request

## 2018-06-23 ENCOUNTER — Encounter (HOSPITAL_COMMUNITY): Payer: Self-pay

## 2018-06-23 ENCOUNTER — Other Ambulatory Visit: Payer: Self-pay

## 2018-06-23 ENCOUNTER — Emergency Department (HOSPITAL_COMMUNITY)
Admission: EM | Admit: 2018-06-23 | Discharge: 2018-06-23 | Disposition: A | Discharge status: Home / Self Care | Payer: Medicaid Other | Attending: Emergency Medicine | Admitting: Emergency Medicine

## 2018-06-23 ENCOUNTER — Emergency Department (HOSPITAL_COMMUNITY): Payer: Medicaid Other

## 2018-06-23 DIAGNOSIS — Z7982 Long term (current) use of aspirin: Secondary | ICD-10-CM | POA: Insufficient documentation

## 2018-06-23 DIAGNOSIS — Z79899 Other long term (current) drug therapy: Secondary | ICD-10-CM | POA: Diagnosis not present

## 2018-06-23 DIAGNOSIS — E119 Type 2 diabetes mellitus without complications: Secondary | ICD-10-CM | POA: Insufficient documentation

## 2018-06-23 DIAGNOSIS — I10 Essential (primary) hypertension: Secondary | ICD-10-CM | POA: Diagnosis not present

## 2018-06-23 DIAGNOSIS — E785 Hyperlipidemia, unspecified: Secondary | ICD-10-CM | POA: Insufficient documentation

## 2018-06-23 DIAGNOSIS — Z7984 Long term (current) use of oral hypoglycemic drugs: Secondary | ICD-10-CM | POA: Diagnosis not present

## 2018-06-23 DIAGNOSIS — Z87891 Personal history of nicotine dependence: Secondary | ICD-10-CM | POA: Insufficient documentation

## 2018-06-23 DIAGNOSIS — G8929 Other chronic pain: Secondary | ICD-10-CM

## 2018-06-23 DIAGNOSIS — M5442 Lumbago with sciatica, left side: Secondary | ICD-10-CM | POA: Insufficient documentation

## 2018-06-23 DIAGNOSIS — M545 Low back pain: Secondary | ICD-10-CM | POA: Diagnosis present

## 2018-06-23 DIAGNOSIS — J45909 Unspecified asthma, uncomplicated: Secondary | ICD-10-CM | POA: Diagnosis not present

## 2018-06-23 DIAGNOSIS — M5432 Sciatica, left side: Secondary | ICD-10-CM

## 2018-06-23 MED ORDER — OXYCODONE-ACETAMINOPHEN 5-325 MG PO TABS: 1.0000 | ORAL_TABLET | Freq: Four times a day (QID) | ORAL | 0 refills | Status: DC | PRN

## 2018-06-23 MED ORDER — HYDROMORPHONE HCL 1 MG/ML IJ SOLN
1.0000 mg | Freq: Once | INTRAMUSCULAR | Status: AC
  Administered 2018-06-23: 1 mg via INTRAMUSCULAR
  Filled 2018-06-23: qty 1

## 2018-06-23 MED ORDER — PREDNISONE 50 MG PO TABS: 50.0000 mg | ORAL_TABLET | Freq: Every day | ORAL | 0 refills | Status: DC

## 2018-06-23 MED ORDER — CYCLOBENZAPRINE HCL 10 MG PO TABS: 10.0000 mg | ORAL_TABLET | Freq: Three times a day (TID) | ORAL | 0 refills | Status: AC | PRN

## 2018-06-23 MED ORDER — OXYCODONE-ACETAMINOPHEN 5-325 MG PO TABS
1.0000 | ORAL_TABLET | Freq: Once | ORAL | Status: AC
  Administered 2018-06-23: 1 via ORAL
  Filled 2018-06-23: qty 1
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
## 2018-06-23 NOTE — Discharge Instructions (Signed)
Follow-up with the neurosurgeon provided.  Return here as needed.

## 2018-06-23 NOTE — ED Notes (Signed)
Patient transported to MRI 

## 2018-06-23 NOTE — ED Notes (Signed)
Patient verbalizes understanding of discharge instructions. Opportunity for questioning and answers were provided. Armband removed by staff, pt discharged from ED.  

## 2018-06-23 NOTE — ED Notes (Signed)
Pt was wheeled to bathroom by one of the visitors with pt.

## 2018-06-23 NOTE — ED Notes (Signed)
Pt back from MRI 

## 2018-06-25 ENCOUNTER — Encounter: Payer: Self-pay | Admitting: Nurse Practitioner

## 2018-06-25 NOTE — Telephone Encounter (Signed)
Mychart message

## 2018-06-27 ENCOUNTER — Encounter: Payer: Self-pay | Admitting: Nurse Practitioner

## 2018-06-28 NOTE — ED Provider Notes (Signed)
[facility] EMERGENCY DEPARTMENT Provider Note   CSN: [id number] Arrival date & time: 06/23/18  1448     History   Chief Complaint Chief Complaint  Patient presents with  . Back Pain    HPI Desiree Hernandez is a 48 y.o. female.  HPI Patient presents to the emergency department with chronic lower back pain that has been ongoing since May.  The patient states that symptoms got worse.  She states she is having to use a walker to get around her house.  The patient states that certain movements palpation make the pain worse.  She states that she has been taking over-the-counter medications without relief of her symptoms.  The patient denies chest pain, shortness of breath, headache,blurred vision, neck pain, fever, cough, weakness, numbness, dizziness, anorexia, edema, abdominal pain, nausea, vomiting, diarrhea, rash,  dysuria, hematemesis, bloody stool, near syncope, or syncope. Past Medical History:  Diagnosis Date  . Arthritis   . Asthma   . Diabetes mellitus without complication ([place])   . Hyperlipidemia   . Hypertension   . Obesity     Patient Active Problem List   Diagnosis Date Noted  . Chronic low back pain without sciatica 12/13/2017  . Depression 10/04/2017  . Type 2 diabetes mellitus with hyperglycemia, without long-term current use of insulin ([place]) 09/01/2017    Past Surgical History:  Procedure Laterality Date  . ANAL FISSURE REPAIR    . TUBAL LIGATION       OB History    Gravida  2   Para      Term      Preterm      AB      Living  2     SAB      TAB      Ectopic      Multiple      Live Births  2            Home Medications    Prior to Admission medications   Medication Sig Start Date End Date Taking? Authorizing Provider  Acetaminophen (TYLENOL ARTHRITIS PAIN PO) Take 625 mg by mouth 2 (two) times daily as needed.    [provider]  acetaminophen-codeine (TYLENOL #3) 300-30 MG tablet Take 1 tablet by  mouth every 6 (six) hours as needed for up to 30 days for moderate pain. 06/18/18 07/18/18  [name], NP  aspirin EC 81 MG tablet Take 1 tablet (81 mg total) by mouth daily. 08/10/17   Brayton Caves, PA-C  atorvastatin (LIPITOR) 20 MG tablet Take 1 tablet (20 mg total) by mouth daily. 08/10/17   Brayton Caves, PA-C  Blood Glucose Monitoring Suppl (TRUE METRIX METER) DEVI 1 kit by Does not apply route 4 (four) times daily. 08/10/17   Brayton Caves, PA-C  Cyanocobalamin (VITAMIN B-12 PO) Take by mouth.    [provider]  cyclobenzaprine (FLEXERIL) 10 MG tablet Take 1 tablet (10 mg total) by mouth 3 (three) times daily as needed for muscle spasms. 06/23/18   Bell Cai, Harrell Gave, PA-C  fenofibrate (TRICOR) 48 MG tablet Take 1 tablet (48 mg total) by mouth daily. 01/17/18   [name], NP  glimepiride (AMARYL) 4 MG tablet TAKE 1 TABLET (4 MG TOTAL) BY MOUTH DAILY BEFORE BREAKFAST. 03/30/18   [name], NP  glucose blood (TRUE METRIX BLOOD GLUCOSE TEST) test strip Use as instructed 08/10/17   Brayton Caves, PA-C  lisinopril (PRINIVIL,ZESTRIL) 5 MG tablet Take  1 tablet (5 mg total) by mouth daily. 08/10/17   Brayton Caves, PA-C  methocarbamol (ROBAXIN) 750 MG tablet Take 1 tablet (750 mg total) by mouth 3 (three) times daily. X 1 week then prn muscle spasm 12/13/17   [name], NP  mupirocin ointment (BACTROBAN) 2 % Apply to affected areas 2 (two) times per day. 06/18/18   [name], NP  oxyCODONE-acetaminophen (PERCOCET/ROXICET) 5-325 MG tablet Take 1 tablet by mouth every 6 (six) hours as needed for severe pain. 06/23/18   Heydi Swango, Harrell Gave, PA-C  predniSONE (DELTASONE) 50 MG tablet Take 1 tablet (50 mg total) by mouth daily. 06/23/18   Isrrael Fluckiger, Harrell Gave, PA-C  sertraline (ZOLOFT) 50 MG tablet TAKE 1 TABLET (50 MG TOTAL) BY MOUTH DAILY. 05/24/18   [name], NP  sitaGLIPtin-metformin (JANUMET) 50-1000 MG tablet Take 1 tablet by mouth 2 (two) times daily  with a meal. 01/17/18   [name], NP  traZODone (DESYREL) 100 MG tablet Take 1 tablet (100 mg total) by mouth at bedtime as needed for up to 30 days for sleep. 06/18/18 07/18/18  [name], NP  TRUEPLUS LANCETS 28G MISC 28 g by Does not apply route 4 (four) times daily. 08/10/17   Brayton Caves, PA-C  Vitamin D, Ergocalciferol, (DRISDOL) 50000 units CAPS capsule Take 1 capsule (50,000 Units total) by mouth every 7 (seven) days. Patient not taking: Reported on 03/16/2018 02/02/18   Argentina Donovan, PA-C    Family History Family History  Problem Relation Age of Onset  . Heart disease Mother   . Heart disease Father   . Alzheimer's disease Father   . Diabetes Father   . Diabetes Sister   . Diabetes Paternal Aunt   . Diabetes Paternal Uncle     Social History Social History   Tobacco Use  . Smoking status: Former Smoker    Types: Cigarettes    Last attempt to quit: 08/29/2011    Years since quitting: 6.8  . Smokeless tobacco: Never Used  . Tobacco comment: Quit on 2013  Substance Use Topics  . Alcohol use: Yes    Comment: occassional  . Drug use: No     Allergies   Nsaids and Penicillins   Review of Systems Review of Systems All other systems negative except as documented in the HPI. All pertinent positives and negatives as reviewed in the HPI.  Physical Exam Updated Vital Signs BP 125/75 (BP Location: Right Arm)   Pulse 90   Temp 98.6 F (37 C) (Oral)   Resp 17   Ht 5' 3"  (1.6 m)   Wt 117.5 kg   SpO2 97%   BMI 45.88 kg/m   Physical Exam Vitals signs and nursing note reviewed.  Constitutional:      General: She is not in acute distress.    Appearance: She is well-developed.  HENT:     Head: Normocephalic and atraumatic.  Eyes:     Pupils: Pupils are equal, round, and reactive to light.  Neck:     Musculoskeletal: Normal range of motion and neck supple.  Cardiovascular:     Rate and Rhythm: Normal rate and regular rhythm.     Heart sounds:  Normal heart sounds. No murmur. No friction rub. No gallop.   Pulmonary:     Effort: Pulmonary effort is normal. No respiratory distress.     Breath sounds: Normal breath sounds. No wheezing.  Skin:    General: Skin is warm and dry.  Capillary Refill: Capillary refill takes less than 2 seconds.     Findings: No erythema or rash.  Neurological:     Mental Status: She is alert and oriented to person, place, and time.     GCS: GCS eye subscore is 4. GCS verbal subscore is 5. GCS motor subscore is 6.     Sensory: Sensation is intact. No sensory deficit.     Motor: No weakness or abnormal muscle tone.     Coordination: Coordination normal.     Deep Tendon Reflexes: Reflexes are normal and symmetric.  Psychiatric:        Behavior: Behavior normal.      ED Treatments / Results  Labs (all labs ordered are listed, but only abnormal results are displayed) Labs Reviewed - No data to display  EKG None  Radiology No results found.  Procedures Procedures (including critical care time)  Medications Ordered in ED Medications  oxyCODONE-acetaminophen (PERCOCET/ROXICET) 5-325 MG per tablet 1 tablet (1 tablet Oral Given 06/23/18 1725)  HYDROmorphone (DILAUDID) injection 1 mg (1 mg Intramuscular Given 06/23/18 2254)     Initial Impression / Assessment and Plan / ED Course  I have reviewed the triage vital signs and the nursing notes.  Pertinent labs & imaging results that were available during my care of the patient were reviewed by me and considered in my medical decision making (see chart for details).     Patient was given neurosurgery follow-up.  Her findings of her MRI did not show any significant neurological impairment at this time.  Patient is able to ambulate with assistance.  Patient is very overweight and this also think contributes to her mobility issues.  Final Clinical Impressions(s) / ED Diagnoses   Final diagnoses:  Sciatica of left side  Chronic bilateral low back  pain with left-sided sciatica    ED Discharge Orders         Ordered    predniSONE (DELTASONE) 50 MG tablet  Daily     06/23/18 2256    cyclobenzaprine (FLEXERIL) 10 MG tablet  3 times daily PRN     06/23/18 2256    oxyCODONE-acetaminophen (PERCOCET/ROXICET) 5-325 MG tablet  Every 6 hours PRN     06/23/18 2256           Dalia Heading, PA-C 06/28/18 0018    Pattricia Boss, MD 06/30/18 [facility]

## 2018-07-05 ENCOUNTER — Other Ambulatory Visit: Payer: Self-pay | Admitting: Nurse Practitioner

## 2018-07-05 DIAGNOSIS — E1165 Type 2 diabetes mellitus with hyperglycemia: Secondary | ICD-10-CM

## 2018-07-05 MED FILL — ?GLIMEPIRIDE 4 MG TABLET: 4 | 30 days supply | Qty: 30 | Fill #0

## 2018-07-05 MED FILL — SERTRALINE HCL 50 MG TABS: 50 | 30 days supply | Qty: 30 | Fill #1

## 2018-07-06 ENCOUNTER — Encounter: Payer: Self-pay | Admitting: Nurse Practitioner

## 2018-07-09 ENCOUNTER — Emergency Department (HOSPITAL_COMMUNITY): Payer: Medicaid Other

## 2018-07-09 ENCOUNTER — Observation Stay (HOSPITAL_COMMUNITY)
Admission: EM | Admit: 2018-07-09 | Discharge: 2018-07-10 | Disposition: A | Discharge status: Home / Self Care | Payer: Medicaid Other | Attending: Family Medicine | Admitting: Family Medicine

## 2018-07-09 ENCOUNTER — Encounter (HOSPITAL_COMMUNITY): Payer: Self-pay | Admitting: *Deleted

## 2018-07-09 DIAGNOSIS — I129 Hypertensive chronic kidney disease with stage 1 through stage 4 chronic kidney disease, or unspecified chronic kidney disease: Secondary | ICD-10-CM | POA: Diagnosis not present

## 2018-07-09 DIAGNOSIS — R0789 Other chest pain: Secondary | ICD-10-CM | POA: Diagnosis present

## 2018-07-09 DIAGNOSIS — M899 Disorder of bone, unspecified: Principal | ICD-10-CM | POA: Insufficient documentation

## 2018-07-09 DIAGNOSIS — Z87891 Personal history of nicotine dependence: Secondary | ICD-10-CM | POA: Insufficient documentation

## 2018-07-09 DIAGNOSIS — Z7982 Long term (current) use of aspirin: Secondary | ICD-10-CM | POA: Insufficient documentation

## 2018-07-09 DIAGNOSIS — E1165 Type 2 diabetes mellitus with hyperglycemia: Secondary | ICD-10-CM | POA: Diagnosis not present

## 2018-07-09 DIAGNOSIS — E1122 Type 2 diabetes mellitus with diabetic chronic kidney disease: Secondary | ICD-10-CM | POA: Insufficient documentation

## 2018-07-09 DIAGNOSIS — Z79899 Other long term (current) drug therapy: Secondary | ICD-10-CM | POA: Diagnosis not present

## 2018-07-09 DIAGNOSIS — E785 Hyperlipidemia, unspecified: Secondary | ICD-10-CM | POA: Insufficient documentation

## 2018-07-09 DIAGNOSIS — Z79891 Long term (current) use of opiate analgesic: Secondary | ICD-10-CM | POA: Insufficient documentation

## 2018-07-09 DIAGNOSIS — D649 Anemia, unspecified: Secondary | ICD-10-CM | POA: Diagnosis not present

## 2018-07-09 DIAGNOSIS — J45909 Unspecified asthma, uncomplicated: Secondary | ICD-10-CM | POA: Insufficient documentation

## 2018-07-09 DIAGNOSIS — N183 Chronic kidney disease, stage 3 (moderate): Secondary | ICD-10-CM | POA: Insufficient documentation

## 2018-07-09 DIAGNOSIS — C799 Secondary malignant neoplasm of unspecified site: Secondary | ICD-10-CM | POA: Diagnosis not present

## 2018-07-09 DIAGNOSIS — M549 Dorsalgia, unspecified: Secondary | ICD-10-CM

## 2018-07-09 DIAGNOSIS — F329 Major depressive disorder, single episode, unspecified: Secondary | ICD-10-CM | POA: Insufficient documentation

## 2018-07-09 DIAGNOSIS — M4856XA Collapsed vertebra, not elsewhere classified, lumbar region, initial encounter for fracture: Secondary | ICD-10-CM | POA: Insufficient documentation

## 2018-07-09 DIAGNOSIS — N189 Chronic kidney disease, unspecified: Secondary | ICD-10-CM

## 2018-07-09 DIAGNOSIS — E669 Obesity, unspecified: Secondary | ICD-10-CM | POA: Diagnosis not present

## 2018-07-09 DIAGNOSIS — R0602 Shortness of breath: Secondary | ICD-10-CM | POA: Diagnosis present

## 2018-07-09 DIAGNOSIS — R079 Chest pain, unspecified: Secondary | ICD-10-CM | POA: Diagnosis present

## 2018-07-09 LAB — CBC
HCT: 24.3 % — ABNORMAL LOW (ref 36.0–46.0)
HCT: 25.1 % — ABNORMAL LOW (ref 36.0–46.0)
HEMOGLOBIN: 7 g/dL — AB (ref 12.0–15.0)
Hemoglobin: 7.4 g/dL — ABNORMAL LOW (ref 12.0–15.0)
MCH: 27.7 pg (ref 26.0–34.0)
MCH: 27.7 pg (ref 26.0–34.0)
MCHC: 28.8 g/dL — AB (ref 30.0–36.0)
MCHC: 29.5 g/dL — ABNORMAL LOW (ref 30.0–36.0)
MCV: 96 fL (ref 80.0–100.0)
MCV: 94 fL (ref 80.0–100.0)
PLATELETS: 316 10*3/uL (ref 150–400)
Platelets: 310 10*3/uL (ref 150–400)
RBC: 2.53 MIL/uL — ABNORMAL LOW (ref 3.87–5.11)
RBC: 2.67 MIL/uL — ABNORMAL LOW (ref 3.87–5.11)
RDW: 16.7 % — AB (ref 11.5–15.5)
RDW: 16.6 % — ABNORMAL HIGH (ref 11.5–15.5)
WBC: 7.9 10*3/uL (ref 4.0–10.5)
WBC: 7.7 10*3/uL (ref 4.0–10.5)
nRBC: 0.5 % — ABNORMAL HIGH (ref 0.0–0.2)
nRBC: 0.5 % — ABNORMAL HIGH (ref 0.0–0.2)

## 2018-07-09 LAB — BASIC METABOLIC PANEL
Anion gap: 10 (ref 5–15)
BUN: 14 mg/dL (ref 6–20)
CALCIUM: 9.1 mg/dL (ref 8.9–10.3)
CO2: 22 mmol/L (ref 22–32)
Chloride: 99 mmol/L (ref 98–111)
Creatinine, Ser: 1.35 mg/dL — ABNORMAL HIGH (ref 0.44–1.00)
GFR calc Af Amer: 54 mL/min — ABNORMAL LOW (ref >60)
GFR, EST NON AFRICAN AMERICAN: 47 mL/min — AB (ref >60)
Glucose, Bld: 232 mg/dL — ABNORMAL HIGH (ref 70–99)
Potassium: 4.1 mmol/L (ref 3.5–5.1)
SODIUM: 131 mmol/L — AB (ref 135–145)

## 2018-07-09 LAB — POC OCCULT BLOOD, ED: FECAL OCCULT BLD: NEGATIVE

## 2018-07-09 LAB — I-STAT BETA HCG BLOOD, ED (MC, WL, AP ONLY): I-stat hCG, quantitative: <5 m[IU]/mL (ref <5)

## 2018-07-09 LAB — I-STAT TROPONIN, ED
TROPONIN I, POC: 0.02 ng/mL (ref 0.00–0.08)
Troponin i, poc: 0 ng/mL (ref 0.00–0.08)

## 2018-07-09 LAB — D-DIMER, QUANTITATIVE (NOT AT ARMC): D DIMER QUANT: 0.78 ug{FEU}/mL — AB (ref 0.00–0.50)

## 2018-07-09 LAB — CBG MONITORING, ED: Glucose-Capillary: 135 mg/dL — ABNORMAL HIGH (ref 70–99)

## 2018-07-09 MED ORDER — FENTANYL CITRATE (PF) 100 MCG/2ML IJ SOLN
50.0000 ug | Freq: Once | INTRAMUSCULAR | Status: AC
  Administered 2018-07-09: 50 ug via INTRAVENOUS
  Filled 2018-07-09: qty 2

## 2018-07-09 MED ORDER — LIDOCAINE 5 % EX PTCH
1.0000 | MEDICATED_PATCH | CUTANEOUS | Status: DC
  Administered 2018-07-09 – 2018-07-10 (×2): 1 via TRANSDERMAL
  Filled 2018-07-09 (×2): qty 1

## 2018-07-09 MED ORDER — IOPAMIDOL (ISOVUE-370) INJECTION 76%
INTRAVENOUS | Status: AC
  Filled 2018-07-09: qty 100

## 2018-07-09 MED ORDER — FENOFIBRATE 54 MG PO TABS
54.0000 mg | ORAL_TABLET | Freq: Every day | ORAL | Status: DC
  Administered 2018-07-10: 54 mg via ORAL
  Filled 2018-07-09: qty 1

## 2018-07-09 MED ORDER — ASPIRIN EC 81 MG PO TBEC
81.0000 mg | DELAYED_RELEASE_TABLET | Freq: Every day | ORAL | Status: DC
  Administered 2018-07-10: 81 mg via ORAL
  Filled 2018-07-09: qty 1

## 2018-07-09 MED ORDER — SODIUM CHLORIDE 0.9% FLUSH
3.0000 mL | Freq: Once | INTRAVENOUS | Status: AC
  Administered 2018-07-09: 3 mL via INTRAVENOUS

## 2018-07-09 MED ORDER — SERTRALINE HCL 50 MG PO TABS
50.0000 mg | ORAL_TABLET | Freq: Every day | ORAL | Status: DC
  Administered 2018-07-10: 50 mg via ORAL
  Filled 2018-07-09: qty 1

## 2018-07-09 MED ORDER — CYCLOBENZAPRINE HCL 10 MG PO TABS
10.0000 mg | ORAL_TABLET | Freq: Three times a day (TID) | ORAL | Status: DC | PRN
  Administered 2018-07-10: 10 mg via ORAL
  Filled 2018-07-09: qty 1

## 2018-07-09 MED ORDER — TRAZODONE HCL 100 MG PO TABS: 100.0000 mg | ORAL_TABLET | Freq: Every evening | ORAL | Status: DC | PRN

## 2018-07-09 MED ORDER — IOPAMIDOL (ISOVUE-370) INJECTION 76%
100.0000 mL | Freq: Once | INTRAVENOUS | Status: AC | PRN
  Administered 2018-07-09: 68 mL via INTRAVENOUS

## 2018-07-09 MED ORDER — ENOXAPARIN SODIUM 40 MG/0.4ML ~~LOC~~ SOLN
40.0000 mg | SUBCUTANEOUS | Status: DC
  Filled 2018-07-09: qty 0.4

## 2018-07-09 MED ORDER — OXYCODONE-ACETAMINOPHEN 5-325 MG PO TABS
1.0000 | ORAL_TABLET | Freq: Four times a day (QID) | ORAL | Status: DC | PRN
  Administered 2018-07-09 – 2018-07-10 (×4): 2 via ORAL
  Filled 2018-07-09 (×4): qty 2

## 2018-07-09 MED ORDER — INSULIN ASPART 100 UNIT/ML ~~LOC~~ SOLN: 0.0000 [IU] | Freq: Every day | SUBCUTANEOUS | Status: DC

## 2018-07-09 MED ORDER — ATORVASTATIN CALCIUM 10 MG PO TABS
20.0000 mg | ORAL_TABLET | Freq: Every day | ORAL | Status: DC
  Administered 2018-07-10: 20 mg via ORAL
  Filled 2018-07-09: qty 2

## 2018-07-09 MED ORDER — INSULIN ASPART 100 UNIT/ML ~~LOC~~ SOLN
0.0000 [IU] | Freq: Three times a day (TID) | SUBCUTANEOUS | Status: DC
  Administered 2018-07-10: 3 [IU] via SUBCUTANEOUS
  Administered 2018-07-10: 8 [IU] via SUBCUTANEOUS
  Administered 2018-07-10: 2 [IU] via SUBCUTANEOUS

## 2018-07-09 NOTE — ED Provider Notes (Signed)
[facility] EMERGENCY DEPARTMENT Provider Note   CSN: [id number] Arrival date & time: 07/09/18  1238     History   Chief Complaint Chief Complaint  Patient presents with  . Chest Pain    HPI Desiree Hernandez is a 48 y.o. female.  The history is provided by the patient.  Chest Pain  Pain location:  L chest Pain quality: aching   Pain radiates to:  Does not radiate Pain severity:  Mild Onset quality:  Gradual Timing:  Intermittent Chronicity:  New Context: breathing, movement, raising an arm and at rest   Relieved by:  Nothing Worsened by:  Certain positions and deep breathing Associated symptoms: no abdominal pain, no back pain, no cough, no fever, no lower extremity edema, no nausea, no numbness, no palpitations, no PND, no shortness of breath, no syncope and no vomiting   Risk factors: diabetes mellitus, high cholesterol and hypertension   Risk factors: no coronary artery disease and no prior DVT/PE     Past Medical History:  Diagnosis Date  . Arthritis   . Asthma   . Diabetes mellitus without complication ([place])   . Hyperlipidemia   . Hypertension   . Obesity     Patient Active Problem List   Diagnosis Date Noted  . Chest pain 07/09/2018  . Metastatic disease ([place]) 07/09/2018  . Chronic low back pain without sciatica 12/13/2017  . Depression 10/04/2017  . Type 2 diabetes mellitus with hyperglycemia, without long-term current use of insulin ([place]) 09/01/2017    Past Surgical History:  Procedure Laterality Date  . ANAL FISSURE REPAIR    . TUBAL LIGATION       OB History    Gravida  2   Para      Term      Preterm      AB      Living  2     SAB      TAB      Ectopic      Multiple      Live Births  2            Home Medications    Prior to Admission medications   Medication Sig Start Date End Date Taking? Authorizing Provider  acetaminophen-codeine (TYLENOL #3) 300-30 MG tablet Take 1 tablet by mouth every 6  (six) hours as needed for up to 30 days for moderate pain. 06/18/18 07/18/18 Yes [name], NP  aspirin EC 81 MG tablet Take 1 tablet (81 mg total) by mouth daily. 08/10/17  Yes Ena Dawley, Tiffany S, PA-C  atorvastatin (LIPITOR) 20 MG tablet Take 1 tablet (20 mg total) by mouth daily. 08/10/17  Yes Noel, Tiffany S, PA-C  Cyanocobalamin (VITAMIN B-12 PO) Take 1 tablet by mouth daily.    Yes [provider]  cyclobenzaprine (FLEXERIL) 10 MG tablet Take 1 tablet (10 mg total) by mouth 3 (three) times daily as needed for muscle spasms. 06/23/18  Yes Lawyer, Harrell Gave, PA-C  fenofibrate (TRICOR) 48 MG tablet Take 1 tablet (48 mg total) by mouth daily. 01/17/18  Yes [name], NP  glimepiride (AMARYL) 4 MG tablet TAKE 1 TABLET (4 MG TOTAL) BY MOUTH DAILY BEFORE BREAKFAST. 07/05/18  Yes [name], NP  oxyCODONE-acetaminophen (PERCOCET/ROXICET) 5-325 MG tablet Take 1 tablet by mouth every 6 (six) hours as needed for severe pain. 06/23/18  Yes Lawyer, Harrell Gave, PA-C  sertraline (ZOLOFT) 50 MG tablet TAKE 1 TABLET (50 MG TOTAL) BY MOUTH DAILY.  05/24/18  Yes [name], NP  sitaGLIPtin-metformin (JANUMET) 50-1000 MG tablet Take 1 tablet by mouth 2 (two) times daily with a meal. 01/17/18  Yes [name], NP  traZODone (DESYREL) 100 MG tablet Take 1 tablet (100 mg total) by mouth at bedtime as needed for up to 30 days for sleep. 06/18/18 07/18/18 Yes [name], NP  Blood Glucose Monitoring Suppl (TRUE METRIX METER) DEVI 1 kit by Does not apply route 4 (four) times daily. 08/10/17   Brayton Caves, PA-C  glucose blood (TRUE METRIX BLOOD GLUCOSE TEST) test strip Use as instructed 08/10/17   Brayton Caves, PA-C  predniSONE (DELTASONE) 50 MG tablet Take 1 tablet (50 mg total) by mouth daily. 06/23/18   Lawyer, Harrell Gave, PA-C  TRUEPLUS LANCETS 28G MISC 28 g by Does not apply route 4 (four) times daily. 08/10/17   Brayton Caves, PA-C    Family History Family History  Problem  Relation Age of Onset  . Heart disease Mother   . Heart disease Father   . Alzheimer's disease Father   . Diabetes Father   . Diabetes Sister   . Diabetes Paternal Aunt   . Diabetes Paternal Uncle     Social History Social History   Tobacco Use  . Smoking status: Former Smoker    Types: Cigarettes    Last attempt to quit: 08/29/2011    Years since quitting: 6.8  . Smokeless tobacco: Never Used  . Tobacco comment: Quit on 2013  Substance Use Topics  . Alcohol use: Yes    Comment: occassional  . Drug use: No     Allergies   Nsaids and Penicillins   Review of Systems Review of Systems  Constitutional: Negative for chills and fever.  HENT: Negative for ear pain and sore throat.   Eyes: Negative for pain and visual disturbance.  Respiratory: Negative for cough and shortness of breath.   Cardiovascular: Positive for chest pain. Negative for palpitations, syncope and PND.  Gastrointestinal: Negative for abdominal pain, nausea and vomiting.  Genitourinary: Negative for dysuria and hematuria.  Musculoskeletal: Negative for arthralgias and back pain.  Skin: Negative for color change and rash.  Neurological: Negative for seizures, syncope and numbness.  All other systems reviewed and are negative.    Physical Exam Updated Vital Signs  ED Triage Vitals  Enc Vitals Group     BP 07/09/18 1246 (!) 146/96     Pulse Rate 07/09/18 1246 (!) 101     Resp 07/09/18 1246 18     Temp 07/09/18 1246 98.6 F (37 C)     Temp Source 07/09/18 1246 Oral     SpO2 07/09/18 1246 96 %     Weight --      Height --      Head Circumference --      Peak Flow --      Pain Score 07/09/18 1245 8     Pain Loc --      Pain Edu? --      Excl. in [place]? --     Physical Exam Vitals signs and nursing note reviewed.  Constitutional:      General: She is not in acute distress.    Appearance: She is well-developed. She is not ill-appearing.  HENT:     Head: Normocephalic and atraumatic.  Eyes:      Extraocular Movements: Extraocular movements intact.     Conjunctiva/sclera: Conjunctivae normal.     Pupils: Pupils are equal,  round, and reactive to light.  Neck:     Musculoskeletal: Normal range of motion and neck supple.  Cardiovascular:     Rate and Rhythm: Normal rate and regular rhythm.     Pulses:          Radial pulses are 2+ on the right side and 2+ on the left side.       Dorsalis pedis pulses are 2+ on the right side and 2+ on the left side.     Heart sounds: No murmur.  Pulmonary:     Effort: Pulmonary effort is normal. No respiratory distress.     Breath sounds: Normal breath sounds. No decreased breath sounds, wheezing, rhonchi or rales.  Chest:     Chest wall: Tenderness present.  Abdominal:     Palpations: Abdomen is soft.     Tenderness: There is no abdominal tenderness.  Genitourinary:    Rectum: Guaiac result negative.  Musculoskeletal:     Right lower leg: No edema.     Left lower leg: No edema.  Skin:    General: Skin is warm and dry.     Capillary Refill: Capillary refill takes less than 2 seconds.  Neurological:     General: No focal deficit present.     Mental Status: She is alert.  Psychiatric:        Mood and Affect: Mood normal.      ED Treatments / Results  Labs (all labs ordered are listed, but only abnormal results are displayed) Labs Reviewed  BASIC METABOLIC PANEL - Abnormal; Notable for the following components:      Result Value   Sodium 131 (*)    Glucose, Bld 232 (*)    Creatinine, Ser 1.35 (*)    GFR calc non Af Amer 47 (*)    GFR calc Af Amer 54 (*)    All other components within normal limits  CBC - Abnormal; Notable for the following components:   RBC 2.53 (*)    Hemoglobin 7.0 (*)    HCT 24.3 (*)    MCHC 28.8 (*)    RDW 16.7 (*)    nRBC 0.5 (*)    All other components within normal limits  D-DIMER, QUANTITATIVE (NOT AT [facility]) - Abnormal; Notable for the following components:   D-Dimer, Quant 0.78 (*)    All other  components within normal limits  CBC - Abnormal; Notable for the following components:   RBC 2.67 (*)    Hemoglobin 7.4 (*)    HCT 25.1 (*)    MCHC 29.5 (*)    RDW 16.6 (*)    nRBC 0.5 (*)    All other components within normal limits  CEA  CANCER ANTIGEN 27.29  MULTIPLE MYELOMA PANEL, SERUM  PATHOLOGIST SMEAR REVIEW  HIV ANTIBODY (ROUTINE TESTING W REFLEX)  IRON AND TIBC  FERRITIN  CBC  HEMOGLOBIN K8M  BASIC METABOLIC PANEL  I-STAT TROPONIN, ED  I-STAT BETA HCG BLOOD, ED ([facility], [facility], [facility] ONLY)  POC OCCULT BLOOD, ED  I-STAT TROPONIN, ED    EKG EKG Interpretation  Date/Time:  Monday July 09 2018 12:42:11 EST Ventricular Rate:  103 PR Interval:  138 QRS Duration: 102 QT Interval:  372 QTC Calculation: 487 R Axis:   19 Text Interpretation:  Sinus tachycardia Incomplete right bundle branch block No significant change since last tracing Confirmed by Lennice Sites [phone]) on 07/09/2018 4:13:32 PM   Radiology Dg Chest 2 View  Result Date: 07/09/2018 CLINICAL DATA:  Chest  pain for few weeks got worse today Htn/diabetes EXAM: CHEST - 2 VIEW COMPARISON:  07/29/2017 FINDINGS: Midline trachea. Normal heart size and mediastinal contours. No pleural effusion or pneumothorax. Mildly low lung volumes with bibasilar volume loss. IMPRESSION: No acute cardiopulmonary disease. Electronically Signed   By: Abigail Miyamoto M.D.   On: 07/09/2018 13:44   Ct Angio Chest Pe W And/or Wo Contrast  Result Date: 07/09/2018 CLINICAL DATA:  Left chest pain and shortness of breath today. EXAM: CT ANGIOGRAPHY CHEST WITH CONTRAST TECHNIQUE: Multidetector CT imaging of the chest was performed using the standard protocol during bolus administration of intravenous contrast. Multiplanar CT image reconstructions and MIPs were obtained to evaluate the vascular anatomy. CONTRAST:  68 mL ISOVUE-370 IOPAMIDOL (ISOVUE-370) INJECTION 76% COMPARISON:  PA and lateral chest earlier today. CT chest 07/29/2017. MRI lumbar spine  01/24/2018 and 06/23/2018. FINDINGS: Cardiovascular: Satisfactory opacification of the pulmonary arteries to the segmental level. No evidence of pulmonary embolism. Normal heart size. No pericardial effusion. Mediastinum/Nodes: No enlarged mediastinal, hilar, or axillary lymph nodes. Thyroid gland, trachea, and esophagus demonstrate no significant findings. Lungs/Pleura: Lungs are clear. No pleural effusion or pneumothorax. Upper Abdomen: Negative. Musculoskeletal: The patient has superior endplate compression fractures of T11, T12, L1 and L2 as seen on the prior MRI scans. No bony retropulsion is identified. Vertebral body height loss is worst at L1 where it is estimated at up to 40%. Lytic lesions are seen in multiple bones including new lytic lesions in the inferior angles of the right scapulae. There is also a new lytic lesion in the inferior aspect of the left scapula. Review of the MIP images confirms the above findings. IMPRESSION: Innumerable lytic including lesions in bone including new lesions in the inferior aspect of both the right and left scapula worrisome for metastatic disease or multiple myeloma. T11-L2 compression fractures are unchanged compared to prior MRI. Negative for pulmonary embolus. Electronically Signed   By: Inge Rise M.D.   On: 07/09/2018 19:08    Procedures Procedures (including critical care time)  Medications Ordered in ED Medications  lidocaine (LIDODERM) 5 % 1 patch (1 patch Transdermal Patch Applied 07/09/18 1632)  iopamidol (ISOVUE-370) 76 % injection (has no administration in time range)  aspirin EC tablet 81 mg (has no administration in time range)  oxyCODONE-acetaminophen (PERCOCET/ROXICET) 5-325 MG per tablet 1-2 tablet (has no administration in time range)  atorvastatin (LIPITOR) tablet 20 mg (has no administration in time range)  fenofibrate tablet 54 mg (has no administration in time range)  sertraline (ZOLOFT) tablet 50 mg (has no administration in  time range)  traZODone (DESYREL) tablet 100 mg (has no administration in time range)  cyclobenzaprine (FLEXERIL) tablet 10 mg (has no administration in time range)  enoxaparin (LOVENOX) injection 40 mg (has no administration in time range)  insulin aspart (novoLOG) injection 0-15 Units (has no administration in time range)  insulin aspart (novoLOG) injection 0-5 Units (has no administration in time range)  sodium chloride flush (NS) 0.9 % injection 3 mL (3 mLs Intravenous Given 07/09/18 1640)  iopamidol (ISOVUE-370) 76 % injection 100 mL (68 mLs Intravenous Contrast Given 07/09/18 1814)  fentaNYL (SUBLIMAZE) injection 50 mcg (50 mcg Intravenous Given 07/09/18 2001)     Initial Impression / Assessment and Plan / ED Course  I have reviewed the triage vital signs and the nursing notes.  Pertinent labs & imaging results that were available during my care of the patient were reviewed by me and considered in my medical decision making (  see chart for details).     Desiree Hernandez is a 48 year old female with history of high cholesterol, hypertension, diabetes who presents the ED with chest pain.  Patient with normal vitals.  No fever.  Patient has had intermittent pain for the last several weeks.  Pain appears reproducible.  EKG showed sinus rhythm.  No obvious ischemic changes.  Patient with multiple cardiac risk factors.  She states that she has been in a lot of pain when she takes deep breath and with movements.  Will get d-dimer to evaluate for PE.  Will get basic labs including troponin, chest x-ray.  No signs of obvious volume overload on exam.  Suspect possible musculoskeletal source versus PE versus ACS.  Patient has low heart score and will get delta troponins.  Patient with elevated dimer and will get PE scan.  Troponin was negative.  Patient did have a hemoglobin of 7 but no signs of melena or hematochezia on exam.  It appears that patient has ongoing anemia as she has been anemic in the past.   Repeat CBC showed hemoglobin of 7.4.  Doubt acute process including GI bleed.  Occult test of the stool was negative as well.  Patient also with CKD.  But creatinine is at baseline.  No other electrolyte abnormalities.  Chest x-ray showed no obvious pneumonia, pneumothorax, pleural effusion.  However, CT scan did show multiple lytic bone lesions throughout especially bilateral scapulas.  These findings are concerning for metastatic process versus multiple myeloma.  Oncology was consulted and they recommend myeloma panel, CT abdomen and pelvis and other labs for oncology work-up.  Patient was given IV fentanyl and will admit for oncology work-up and for pain control.  Patient admitted to the hospitalist service.  Doubt cardiac process.  Suspect underlying oncology process causing lab abnormality and pain today.  Hemodynamically stable throughout my care and admitted in stable condition.  This chart was dictated using voice recognition software.  Despite best efforts to proofread,  errors can occur which can change the documentation meaning.    Final Clinical Impressions(s) / ED Diagnoses   Final diagnoses:  Chest wall pain  Anemia, unspecified type  Chronic kidney disease, unspecified CKD stage  Lytic bone lesions on xray    ED Discharge Orders    None       Lennice Sites, DO 07/09/18 2218

## 2018-07-09 NOTE — ED Notes (Signed)
Pharmacy messaged about unverified meds 

## 2018-07-09 NOTE — ED Notes (Signed)
ED Provider at bedside. 

## 2018-07-09 NOTE — ED Triage Notes (Signed)
Pt in c/o chest pain for the last two weeks, states the pain has been constant, worse with inspiration or movement, also worse if she leans forward, denies recent travel or hormone replacement, denies cough, reports exertional shortness of breath

## 2018-07-09 NOTE — ED Notes (Signed)
Patient transported to CT 

## 2018-07-09 NOTE — H&P (Addendum)
History and Physical    AMANADA PHILBRICK QGB:[id number] DOB: Jan 04, 1971 DOA: 07/09/2018  PCP: [name], NP Patient coming from: Home  Chief Complaint: Chest pain  HPI: Desiree Hernandez is a 48 y.o. female with medical history significant of asthma, type 2 diabetes, hypertension, hyperlipidemia, obesity presenting to the hospital for evaluation of chest pain.  Patient reports a 1 month history of substernal chest pain which radiates to both sides of the chest.  States her chest pain is worse when coughing, bending over, laughing, or any movement.  Chest pain is associated with shortness of breath.  No associated nausea, vomiting, or diaphoresis.  Reports having pain in her back since May 2019.  Denies having any fevers, night sweats, or weight loss.  Denies noticing any blood in her stool.  Review of Systems: As per HPI otherwise 10 point review of systems negative.  Past Medical History:  Diagnosis Date  . Arthritis   . Asthma   . Diabetes mellitus without complication ([place])   . Hyperlipidemia   . Hypertension   . Obesity     Past Surgical History:  Procedure Laterality Date  . ANAL FISSURE REPAIR    . TUBAL LIGATION       reports that she quit smoking about 6 years ago. Her smoking use included cigarettes. She has never used smokeless tobacco. She reports current alcohol use. She reports that she does not use drugs.  Allergies  Allergen Reactions  . Nsaids Other (See Comments)    AKI  . Penicillins Rash    Childhood allergy Has patient had a PCN reaction causing immediate rash, facial/tongue/throat swelling, SOB or lightheadedness with hypotension: Yes Has patient had a PCN reaction causing severe rash involving mucus membranes or skin necrosis: No Has patient had a PCN reaction that required hospitalization No Has patient had a PCN reaction occurring within the last 10 years: No If all of the above answers are "NO", then may proceed with Cephalosporin use.      Family History  Problem Relation Age of Onset  . Heart disease Mother   . Heart disease Father   . Alzheimer's disease Father   . Diabetes Father   . Diabetes Sister   . Diabetes Paternal Aunt   . Diabetes Paternal Uncle     Prior to Admission medications   Medication Sig Start Date End Date Taking? Authorizing Provider  Acetaminophen (TYLENOL ARTHRITIS PAIN PO) Take 625 mg by mouth 2 (two) times daily as needed.    [provider]  acetaminophen-codeine (TYLENOL #3) 300-30 MG tablet Take 1 tablet by mouth every 6 (six) hours as needed for up to 30 days for moderate pain. 06/18/18 07/18/18  [name], NP  aspirin EC 81 MG tablet Take 1 tablet (81 mg total) by mouth daily. 08/10/17   Brayton Caves, PA-C  atorvastatin (LIPITOR) 20 MG tablet Take 1 tablet (20 mg total) by mouth daily. 08/10/17   Brayton Caves, PA-C  Blood Glucose Monitoring Suppl (TRUE METRIX METER) DEVI 1 kit by Does not apply route 4 (four) times daily. 08/10/17   Brayton Caves, PA-C  Cyanocobalamin (VITAMIN B-12 PO) Take by mouth.    [provider]  cyclobenzaprine (FLEXERIL) 10 MG tablet Take 1 tablet (10 mg total) by mouth 3 (three) times daily as needed for muscle spasms. 06/23/18   Lawyer, Harrell Gave, PA-C  fenofibrate (TRICOR) 48 MG tablet Take 1 tablet (48 mg total) by mouth daily. 01/17/18  [name], NP  glimepiride (AMARYL) 4 MG tablet TAKE 1 TABLET (4 MG TOTAL) BY MOUTH DAILY BEFORE BREAKFAST. 07/05/18   [name], NP  glucose blood (TRUE METRIX BLOOD GLUCOSE TEST) test strip Use as instructed 08/10/17   Brayton Caves, PA-C  lisinopril (PRINIVIL,ZESTRIL) 5 MG tablet Take 1 tablet (5 mg total) by mouth daily. 08/10/17   Brayton Caves, PA-C  methocarbamol (ROBAXIN) 750 MG tablet Take 1 tablet (750 mg total) by mouth 3 (three) times daily. X 1 week then prn muscle spasm 12/13/17   [name], NP  mupirocin ointment (BACTROBAN) 2 % Apply to affected areas 2 (two)  times per day. 06/18/18   [name], NP  oxyCODONE-acetaminophen (PERCOCET/ROXICET) 5-325 MG tablet Take 1 tablet by mouth every 6 (six) hours as needed for severe pain. 06/23/18   Lawyer, Harrell Gave, PA-C  predniSONE (DELTASONE) 50 MG tablet Take 1 tablet (50 mg total) by mouth daily. 06/23/18   Lawyer, Harrell Gave, PA-C  sertraline (ZOLOFT) 50 MG tablet TAKE 1 TABLET (50 MG TOTAL) BY MOUTH DAILY. 05/24/18   [name], NP  sitaGLIPtin-metformin (JANUMET) 50-1000 MG tablet Take 1 tablet by mouth 2 (two) times daily with a meal. 01/17/18   [name], NP  traZODone (DESYREL) 100 MG tablet Take 1 tablet (100 mg total) by mouth at bedtime as needed for up to 30 days for sleep. 06/18/18 07/18/18  [name], NP  TRUEPLUS LANCETS 28G MISC 28 g by Does not apply route 4 (four) times daily. 08/10/17   Brayton Caves, PA-C  Vitamin D, Ergocalciferol, (DRISDOL) 50000 units CAPS capsule Take 1 capsule (50,000 Units total) by mouth every 7 (seven) days. Patient not taking: Reported on 03/16/2018 02/02/18   Argentina Donovan, PA-C    Physical Exam: Vitals:   07/09/18 2300 07/10/18 0000 07/10/18 0055 07/10/18 0500  BP: 129/72 136/78 (!) 158/83 136/82  Pulse: 90 (!) 103 (!) 106 (!) 101  Resp: (!) _0 Temp:   98.1 F (36.7 C) 98.2 F (36.8 C)  TempSrc:   Oral Oral  SpO2: 96% 97% 97% 98%    Physical Exam  Constitutional: She is oriented to person, place, and time. She appears well-developed and well-nourished. No distress.  HENT:  Head: Normocephalic.  Mouth/Throat: Oropharynx is clear and moist.  Eyes: Right eye exhibits no discharge. Left eye exhibits no discharge.  Neck: Neck supple.  Cardiovascular: Normal rate, regular rhythm and intact distal pulses.  Pulmonary/Chest: Effort normal and breath sounds normal. No respiratory distress. She has no wheezes. She has no rales.  Abdominal: Soft. Bowel sounds are normal. She exhibits no distension. There is no abdominal  tenderness. There is no guarding.  Musculoskeletal:        General: No edema.  Neurological: She is alert and oriented to person, place, and time.  Skin: Skin is warm and dry. She is not diaphoretic.  Psychiatric: She has a normal mood and affect. Her behavior is normal.     Labs on Admission: I have personally reviewed following labs and imaging studies  CBC: Recent Labs  Lab 07/09/18 1300 07/09/18 1641 07/10/18 0323  WBC 7.9 7.7 7.4  HGB 7.0* 7.4* 6.7*  HCT 24.3* 25.1* 22.0*  MCV 96.0 94.0 93.2  PLT 316 310 768   Basic Metabolic Panel: Recent Labs  Lab 07/09/18 1300 07/10/18 0323  NA 131* 132*  K 4.1 4.0  CL 99 99  CO2 22  24  GLUCOSE 232* 230*  BUN 14 12  CREATININE 1.35* 1.37*  CALCIUM 9.1 8.6*   GFR: CrCl cannot be calculated (Unknown ideal weight.). Liver Function Tests: No results for input(s): AST, ALT, ALKPHOS, BILITOT, PROT, ALBUMIN in the last 168 hours. No results for input(s): LIPASE, AMYLASE in the last 168 hours. No results for input(s): AMMONIA in the last 168 hours. Coagulation Profile: No results for input(s): INR, PROTIME in the last 168 hours. Cardiac Enzymes: No results for input(s): CKTOTAL, CKMB, CKMBINDEX, TROPONINI in the last 168 hours. BNP (last 3 results) No results for input(s): PROBNP in the last 8760 hours. HbA1C: Recent Labs    07/10/18 0323  HGBA1C 7.1*   CBG: Recent Labs  Lab 07/09/18 2220  GLUCAP 135*   Lipid Profile: No results for input(s): CHOL, HDL, LDLCALC, TRIG, CHOLHDL, LDLDIRECT in the last 72 hours. Thyroid Function Tests: No results for input(s): TSH, T4TOTAL, FREET4, T3FREE, THYROIDAB in the last 72 hours. Anemia Panel: Recent Labs    07/10/18 0323  FERRITIN 86  TIBC 280  IRON 54   Urine analysis: No results found for: COLORURINE, APPEARANCEUR, LABSPEC, PHURINE, GLUCOSEU, HGBUR, BILIRUBINUR, KETONESUR, PROTEINUR, UROBILINOGEN, NITRITE, LEUKOCYTESUR  Radiological Exams on Admission: Dg Chest 2  View  Result Date: 07/09/2018 CLINICAL DATA:  Chest pain for few weeks got worse today Htn/diabetes EXAM: CHEST - 2 VIEW COMPARISON:  07/29/2017 FINDINGS: Midline trachea. Normal heart size and mediastinal contours. No pleural effusion or pneumothorax. Mildly low lung volumes with bibasilar volume loss. IMPRESSION: No acute cardiopulmonary disease. Electronically Signed   By: Abigail Miyamoto M.D.   On: 07/09/2018 13:44   Ct Angio Chest Pe W And/or Wo Contrast  Result Date: 07/09/2018 CLINICAL DATA:  Left chest pain and shortness of breath today. EXAM: CT ANGIOGRAPHY CHEST WITH CONTRAST TECHNIQUE: Multidetector CT imaging of the chest was performed using the standard protocol during bolus administration of intravenous contrast. Multiplanar CT image reconstructions and MIPs were obtained to evaluate the vascular anatomy. CONTRAST:  68 mL ISOVUE-370 IOPAMIDOL (ISOVUE-370) INJECTION 76% COMPARISON:  PA and lateral chest earlier today. CT chest 07/29/2017. MRI lumbar spine 01/24/2018 and 06/23/2018. FINDINGS: Cardiovascular: Satisfactory opacification of the pulmonary arteries to the segmental level. No evidence of pulmonary embolism. Normal heart size. No pericardial effusion. Mediastinum/Nodes: No enlarged mediastinal, hilar, or axillary lymph nodes. Thyroid gland, trachea, and esophagus demonstrate no significant findings. Lungs/Pleura: Lungs are clear. No pleural effusion or pneumothorax. Upper Abdomen: Negative. Musculoskeletal: The patient has superior endplate compression fractures of T11, T12, L1 and L2 as seen on the prior MRI scans. No bony retropulsion is identified. Vertebral body height loss is worst at L1 where it is estimated at up to 40%. Lytic lesions are seen in multiple bones including new lytic lesions in the inferior angles of the right scapulae. There is also a new lytic lesion in the inferior aspect of the left scapula. Review of the MIP images confirms the above findings. IMPRESSION:  Innumerable lytic including lesions in bone including new lesions in the inferior aspect of both the right and left scapula worrisome for metastatic disease or multiple myeloma. T11-L2 compression fractures are unchanged compared to prior MRI. Negative for pulmonary embolus. Electronically Signed   By: Inge Rise M.D.   On: 07/09/2018 19:08    EKG: Independently reviewed.  Sinus tachycardia (heart rate 103), RBBB.  No significant change since prior tracing.  Assessment/Plan Principal Problem:   Metastatic disease ([place]) Active Problems:   Type 2 diabetes mellitus  with hyperglycemia, without long-term current use of insulin ([facility])   Chest pain   Back pain   Anemia   Metastatic disease -CTA showing innumerable bone lytic lesions including new lesions in the inferior aspect of both the right and left scapula worrisome for metastatic disease.  Suspect multiple myeloma in the setting of anemia. -ED provider spoke to Dr. Jana Hakim who recommended ordering CEA, cancer antigen 27.29, multiple myeloma panel, and blood smear.  CT abdomen pelvis for staging.  Oncology will consult. -Percocet PRN pain  Chest pain Patient reports a one-month history of chest pain.  Likely 2/2 metastatic disease.  Troponin x2 negative and EKG not suggestive of ACS. CT angiogram negative for PE.  Continue to monitor.  Back pain Patient reports having back pain since May 2019.  Imaging showing T11-L2 compression fractures stable compared to prior MRI. -Percocet PRN pain  Chronic normocytic anemia -Hemoglobin 7.4, baseline 7.9-10.5. -FOBT negative -Check iron, ferritin, TIBC -Continue to monitor CBC  Type 2 diabetes -Blood glucose 232. -Check A1c -Sliding scale insulin and CBG checks  CKD 3 -Creatinine 1.3, at baseline.  Hyperlipidemia -Continue statin  Depression -Continue Zoloft  DVT prophylaxis: Lovenox Code Status: Full code Family Communication: Mother and niece at bedside. Disposition Plan:  Anticipate discharge in 1 to 2 days. Consults called: Oncology Admission status: Observation   Shela Leff MD [facility] Pager [phone]  If 7PM-7AM, please contact night-coverage [email or web address] Password [facility]  07/10/2018, 8:33 AM

## 2018-07-10 ENCOUNTER — Other Ambulatory Visit: Payer: Self-pay | Admitting: Oncology

## 2018-07-10 ENCOUNTER — Telehealth: Payer: Self-pay | Admitting: Oncology

## 2018-07-10 DIAGNOSIS — M549 Dorsalgia, unspecified: Secondary | ICD-10-CM

## 2018-07-10 DIAGNOSIS — C799 Secondary malignant neoplasm of unspecified site: Secondary | ICD-10-CM | POA: Diagnosis not present

## 2018-07-10 DIAGNOSIS — D649 Anemia, unspecified: Secondary | ICD-10-CM

## 2018-07-10 LAB — IRON AND TIBC
IRON: 54 ug/dL (ref 28–170)
Saturation Ratios: 19 % (ref 10.4–31.8)
TIBC: 280 ug/dL (ref 250–450)
UIBC: 226 ug/dL

## 2018-07-10 LAB — BASIC METABOLIC PANEL
Anion gap: 9 (ref 5–15)
BUN: 12 mg/dL (ref 6–20)
CO2: 24 mmol/L (ref 22–32)
Calcium: 8.6 mg/dL — ABNORMAL LOW (ref 8.9–10.3)
Chloride: 99 mmol/L (ref 98–111)
Creatinine, Ser: 1.37 mg/dL — ABNORMAL HIGH (ref 0.44–1.00)
GFR calc Af Amer: 53 mL/min — ABNORMAL LOW (ref >60)
GFR calc non Af Amer: 46 mL/min — ABNORMAL LOW (ref >60)
Glucose, Bld: 230 mg/dL — ABNORMAL HIGH (ref 70–99)
Potassium: 4 mmol/L (ref 3.5–5.1)
Sodium: 132 mmol/L — ABNORMAL LOW (ref 135–145)

## 2018-07-10 LAB — PATHOLOGIST SMEAR REVIEW

## 2018-07-10 LAB — CBC
HCT: 22 % — ABNORMAL LOW (ref 36.0–46.0)
Hemoglobin: 6.7 g/dL — CL (ref 12.0–15.0)
MCH: 28.4 pg (ref 26.0–34.0)
MCHC: 30.5 g/dL (ref 30.0–36.0)
MCV: 93.2 fL (ref 80.0–100.0)
Platelets: 311 10*3/uL (ref 150–400)
RBC: 2.36 MIL/uL — ABNORMAL LOW (ref 3.87–5.11)
RDW: 16.7 % — ABNORMAL HIGH (ref 11.5–15.5)
WBC: 7.4 10*3/uL (ref 4.0–10.5)
nRBC: 0.4 % — ABNORMAL HIGH (ref 0.0–0.2)

## 2018-07-10 LAB — HEMOGLOBIN A1C
Hgb A1c MFr Bld: 7.1 % — ABNORMAL HIGH (ref 4.8–5.6)
Mean Plasma Glucose: 157.07 mg/dL

## 2018-07-10 LAB — HIV ANTIBODY (ROUTINE TESTING W REFLEX): HIV Screen 4th Generation wRfx: NONREACTIVE

## 2018-07-10 LAB — GLUCOSE, CAPILLARY: Glucose-Capillary: 297 mg/dL — ABNORMAL HIGH (ref 70–99)

## 2018-07-10 LAB — NO BLOOD PRODUCTS

## 2018-07-10 LAB — FERRITIN: Ferritin: 86 ng/mL (ref 11–307)

## 2018-07-10 MED ORDER — PNEUMOCOCCAL VAC POLYVALENT 25 MCG/0.5ML IJ INJ: 0.5000 mL | INJECTION | INTRAMUSCULAR | Status: DC

## 2018-07-10 MED ORDER — OXYCODONE-ACETAMINOPHEN 5-325 MG PO TABS: 1.0000 | ORAL_TABLET | Freq: Four times a day (QID) | ORAL | 0 refills | Status: DC | PRN

## 2018-07-10 MED ORDER — SODIUM CHLORIDE 0.9% IV SOLUTION: Freq: Once | INTRAVENOUS | Status: DC

## 2018-07-10 NOTE — Telephone Encounter (Signed)
A newpatient appt has been scheduled for the pt to see Dr. Jana Hakim on 2/14 at 3pm.

## 2018-07-10 NOTE — Progress Notes (Signed)
CRITICAL VALUE ALERT  Critical Value Hgb 6.7  Date & Time Notied:  07/10/2018 0500  Provider Notified: Lamar Blinks  Orders Received/Actions taken: transfusion ordered

## 2018-07-10 NOTE — Discharge Instructions (Signed)
Desiree Hernandez,  You were in the hospital because of your pain and have a concern for cancer. Please follow-up with your PCP and the oncologist.

## 2018-07-10 NOTE — Progress Notes (Signed)
Blood refusal form signed and faxed to blood bank.

## 2018-07-10 NOTE — Progress Notes (Signed)
Text paged Dr. Lonny Prude that patient is Jehovah's Witness and refuses blood transfusion. Refusal of blood products form signed and placed on chart

## 2018-07-10 NOTE — Plan of Care (Signed)
  Problem: Clinical Measurements: Goal: Ability to maintain clinical measurements within normal limits will improve Outcome: Progressing   
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
## 2018-07-11 ENCOUNTER — Encounter: Payer: Self-pay | Admitting: Nurse Practitioner

## 2018-07-11 LAB — CANCER ANTIGEN 27.29: CA 27.29: 11.1 U/mL (ref 0.0–38.6)

## 2018-07-11 LAB — CEA: CEA: 0.4 ng/mL (ref 0.0–4.7)

## 2018-07-12 LAB — MULTIPLE MYELOMA PANEL, SERUM
Albumin SerPl Elph-Mcnc: 3.3 g/dL (ref 2.9–4.4)
Albumin/Glob SerPl: 0.6 — ABNORMAL LOW (ref 0.7–1.7)
Alpha 1: 0.3 g/dL (ref 0.0–0.4)
Alpha2 Glob SerPl Elph-Mcnc: 1 g/dL (ref 0.4–1.0)
B-Globulin SerPl Elph-Mcnc: 0.9 g/dL (ref 0.7–1.3)
Gamma Glob SerPl Elph-Mcnc: 4 g/dL — ABNORMAL HIGH (ref 0.4–1.8)
Globulin, Total: 6.1 g/dL — ABNORMAL HIGH (ref 2.2–3.9)
IgA: 33 mg/dL — ABNORMAL LOW (ref 87–352)
IgG (Immunoglobin G), Serum: 4207 mg/dL — ABNORMAL HIGH (ref 700–1600)
IgM (Immunoglobulin M), Srm: 26 mg/dL (ref 26–217)
M Protein SerPl Elph-Mcnc: 3.7 g/dL — ABNORMAL HIGH
TOTAL PROTEIN ELP: 9.4 g/dL — AB (ref 6.0–8.5)

## 2018-07-12 NOTE — Progress Notes (Signed)
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 Specifically we reviewed the biology of the patient's diagnosis and the specifics of her situation.  We reviewed the different types of cancers including carcinomas sarcomas and white cell cancers, we reviewed the white cell cancers including lymphomas leukemias and myeloma and we talked about the immune system in terms of the different kinds of cells in the system and the cells that give rise to myeloma.  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  Her plasma cells make a protein called immunoglobulin which is detectable on SPEP because all the cancer cells come from the same parent cell and so they all make the ventricle protein.  Plasma  cells mostly live in the marrow bones, where they cause holes and of course Aasiyah has quite a few lytic lesions and compression fractures of her spine.    The plasma cells also take up marrow space and the marrow is where all blood cells are made, so it is not surprising that she  is anemic.  In addition she has some renal insufficiency and that means that she likely is not making enough erythropoietin to attain a normal hemoglobin.  Her ferritin is 86 with a saturation of 19.  She may benefit from additional iron and erythropoietin supplementation.  We discussed the fact that myeloma with our current knowledge base is not a curable disease, but that it is very treatable.  There are multiple initial treatment options, the goal of which is to get the patient into remission.  If remission is attained the patient may be a candidate for transplant and after that for maintenance.  Euna lives 2 blocks away from the cancer center in [place] and would like to be treated there.  I have sent a note to my partners there to see if they can see her in the next few days to complete her work-up and get the treatment started  I have refilled her Percocet so that she will have enough until her upcoming visit at the [facility] has a good understanding of the overall plan. She agrees with it. She knows the goal of treatment in her case is control. She will call with any problems that may develop before her next visit here.   Magrinat, Virgie Dad, MD  07/13/18 5:07 PM Medical Oncology and Hematology [facility] [address] [place], [place] [address] Tel. [phone]    Fax. [phone]   I, Jacqualyn Posey am acting as a Education administrator for Chauncey Cruel, MD.   I, Lurline Del MD, have reviewed the above documentation for accuracy and completeness, and I agree with the above.

## 2018-07-13 ENCOUNTER — Encounter: Payer: Self-pay | Admitting: Oncology

## 2018-07-13 ENCOUNTER — Inpatient Hospital Stay: Payer: Medicaid Other | Attending: Oncology | Admitting: Oncology

## 2018-07-13 ENCOUNTER — Telehealth: Payer: Self-pay | Admitting: *Deleted

## 2018-07-13 VITALS — BP 134/91 | HR 102 | Temp 98.8°F | Resp 18 | Ht 63.0 in | Wt 255.4 lb

## 2018-07-13 DIAGNOSIS — G893 Neoplasm related pain (acute) (chronic): Secondary | ICD-10-CM | POA: Diagnosis not present

## 2018-07-13 DIAGNOSIS — I10 Essential (primary) hypertension: Secondary | ICD-10-CM

## 2018-07-13 DIAGNOSIS — Z6841 Body Mass Index (BMI) 40.0 and over, adult: Secondary | ICD-10-CM | POA: Insufficient documentation

## 2018-07-13 DIAGNOSIS — C9000 Multiple myeloma not having achieved remission: Secondary | ICD-10-CM | POA: Insufficient documentation

## 2018-07-13 DIAGNOSIS — E1165 Type 2 diabetes mellitus with hyperglycemia: Secondary | ICD-10-CM | POA: Insufficient documentation

## 2018-07-13 DIAGNOSIS — D649 Anemia, unspecified: Secondary | ICD-10-CM | POA: Insufficient documentation

## 2018-07-13 DIAGNOSIS — C7951 Secondary malignant neoplasm of bone: Secondary | ICD-10-CM | POA: Insufficient documentation

## 2018-07-13 DIAGNOSIS — C799 Secondary malignant neoplasm of unspecified site: Secondary | ICD-10-CM

## 2018-07-13 MED ORDER — OXYCODONE-ACETAMINOPHEN 5-325 MG PO TABS: 1.0000 | ORAL_TABLET | Freq: Four times a day (QID) | ORAL | 0 refills | Status: AC | PRN

## 2018-07-13 NOTE — Telephone Encounter (Signed)
This RN contacted [facility] per MD request for referral to be seen next week due to new diagnosis for Multiple Myeloma.  Informed by Pamala Hurry - new patient scheduler - need for records to be sent for MD review for scheduling of appointment.  This RN obtained current records and faxed to [phone].

## 2018-07-16 ENCOUNTER — Telehealth: Payer: Self-pay | Admitting: Oncology

## 2018-07-16 NOTE — Telephone Encounter (Signed)
No los °

## 2018-07-18 ENCOUNTER — Telehealth: Payer: Self-pay

## 2018-07-18 NOTE — Telephone Encounter (Signed)
Pt called to request refill on Percocet.  Pt reports needing to take 2 tablets every 6-7 hours to control pain.  Pt is not scheduled to see provider at [facility] until 07/23/2018.    Will review with MD for approval.  Pt aware.

## 2018-07-20 MED FILL — ?ATORVASTATIN 20 MG TABLET: 20 | 30 days supply | Qty: 30 | Fill #8

## 2018-07-20 MED FILL — FENOFIBRATE 48 MG TABLET: 48 | 30 days supply | Qty: 30 | Fill #0

## 2018-07-23 DIAGNOSIS — C9000 Multiple myeloma not having achieved remission: Secondary | ICD-10-CM | POA: Diagnosis not present

## 2018-07-24 ENCOUNTER — Ambulatory Visit: Payer: Medicaid Other | Attending: Nurse Practitioner | Admitting: Nurse Practitioner

## 2018-07-24 ENCOUNTER — Encounter: Payer: Self-pay | Admitting: Nurse Practitioner

## 2018-07-24 VITALS — BP 139/84 | HR 94 | Temp 99.5°F | Ht 63.0 in | Wt 257.0 lb

## 2018-07-24 DIAGNOSIS — S22080S Wedge compression fracture of T11-T12 vertebra, sequela: Secondary | ICD-10-CM

## 2018-07-24 DIAGNOSIS — E1165 Type 2 diabetes mellitus with hyperglycemia: Secondary | ICD-10-CM | POA: Diagnosis not present

## 2018-07-24 DIAGNOSIS — N3281 Overactive bladder: Secondary | ICD-10-CM | POA: Diagnosis not present

## 2018-07-24 LAB — POCT URINALYSIS DIP (CLINITEK)
Bilirubin, UA: NEGATIVE
Blood, UA: NEGATIVE
Glucose, UA: NEGATIVE mg/dL
Ketones, POC UA: NEGATIVE mg/dL
Leukocytes, UA: NEGATIVE
Nitrite, UA: NEGATIVE
Spec Grav, UA: 1.015 (ref 1.010–1.025)
Urobilinogen, UA: 0.2 E.U./dL
pH, UA: 7 (ref 5.0–8.0)

## 2018-07-24 LAB — GLUCOSE, POCT (MANUAL RESULT ENTRY): POC Glucose: 136 mg/dl — AB (ref 70–99)

## 2018-07-24 MED ORDER — ACETAMINOPHEN-CODEINE #3 300-30 MG PO TABS: 2.0000 | ORAL_TABLET | Freq: Four times a day (QID) | ORAL | 1 refills | Status: AC | PRN

## 2018-07-24 MED ORDER — TOLTERODINE TARTRATE ER 4 MG PO CP24: 4.0000 mg | ORAL_CAPSULE | Freq: Every day | ORAL | 2 refills | Status: AC

## 2018-07-24 MED FILL — ACETAMINOPHEN/COD #3 TABLET: 300-30 | 7 days supply | Qty: 60 | Fill #0

## 2018-07-24 NOTE — Progress Notes (Signed)
Assessment & Plan:  Desiree Hernandez was seen today for follow-up.  Diagnoses and all orders for this visit:  Type 2 diabetes mellitus with hyperglycemia, without long-term current use of insulin (HCC) -     Glucose (CBG)  Overactive bladder -     POCT URINALYSIS DIP (CLINITEK) -     Urine Culture -     tolterodine (DETROL LA) 4 MG 24 hr capsule; Take 1 capsule (4 mg total) by mouth daily for 30 days.  Compression fracture of T11 vertebra, sequela -     acetaminophen-codeine (TYLENOL #3) 300-30 MG tablet; Take 2 tablets by mouth every 6 (six) hours as needed for up to 30 days for moderate pain.    Patient has been counseled on age-appropriate routine health concerns for screening and prevention. These are reviewed and up-to-date. Referrals have been placed accordingly. Immunizations are up-to-date or declined.    Subjective:   Chief Complaint  Patient presents with  . Follow-up    Pt. is here for FU of her visit from the [facility].    HPI Desiree Hernandez 48 y.o. female presents to office today for hospital follow up. She was admitted to the hospital on 07-09-2018 with chest pain, bone pain and shortness of breath. After extensive work up she was diagnosed with IgG kappa gammopathy/plamsa cell cancer/MM. She will be treated at the cancer center in [place]. She has a history of DM, HTN and HPL which I will continue to treat.   DM TYPE 2 A1c increased due to recent steroid use. She denies any hypo or hyperglycemic symptoms. Current medications include janumet 50-1000 mg bid and amaryl 4 mg daily. She is overdue for eye exam. Patient has been advised to apply for financial assistance and schedule to see our financial counselor.  Lab Results  Component Value Date   HGBA1C 7.1 (H) 07/10/2018   Overactive Bladder Patient complains of incontinence and urgency She has had symptoms for several months. Patient also complains of back pain due to compression fractures.  Patient denies fever, stomach  ache and vaginal discharge. Patient does not have a history of recurrent UTI.  Patient does not have a history of pyelonephritis.     Review of Systems  Constitutional: Negative for fever, malaise/fatigue and weight loss.  HENT: Negative.  Negative for nosebleeds.   Eyes: Negative.  Negative for blurred vision, double vision and photophobia.  Respiratory: Negative.  Negative for cough and shortness of breath.   Cardiovascular: Negative.  Negative for chest pain, palpitations and leg swelling.  Gastrointestinal: Negative.  Negative for heartburn, nausea and vomiting.  Genitourinary: Positive for urgency. Negative for dysuria, flank pain, frequency and hematuria.       SEE HPI  Musculoskeletal: Positive for back pain. Negative for myalgias.  Neurological: Positive for weakness. Negative for dizziness, focal weakness, seizures and headaches.  Psychiatric/Behavioral: Positive for depression. Negative for suicidal ideas.    Past Medical History:  Diagnosis Date  . Arthritis   . Asthma   . Diabetes mellitus without complication ([place])   . Hyperlipidemia   . Hypertension   . Obesity     Past Surgical History:  Procedure Laterality Date  . ANAL FISSURE REPAIR    . TUBAL LIGATION      Family History  Problem Relation Age of Onset  . Heart disease Mother   . Heart disease Father   . Alzheimer's disease Father   . Diabetes Father   . Diabetes Sister   . Diabetes Paternal  Aunt   . Diabetes Paternal Uncle     Social History Reviewed with no changes to be made today.   Outpatient Medications Prior to Visit  Medication Sig Dispense Refill  . aspirin EC 81 MG tablet Take 1 tablet (81 mg total) by mouth daily. 150 tablet 1  . atorvastatin (LIPITOR) 20 MG tablet Take 1 tablet (20 mg total) by mouth daily. 90 tablet 3  . Blood Glucose Monitoring Suppl (TRUE METRIX METER) DEVI 1 kit by Does not apply route 4 (four) times daily. 1 Device 0  . Cyanocobalamin (VITAMIN B-12 PO) Take 1  tablet by mouth daily.     . cyclobenzaprine (FLEXERIL) 10 MG tablet Take 1 tablet (10 mg total) by mouth 3 (three) times daily as needed for muscle spasms. 15 tablet 0  . fenofibrate (TRICOR) 48 MG tablet Take 1 tablet (48 mg total) by mouth daily. 90 tablet 1  . glimepiride (AMARYL) 4 MG tablet TAKE 1 TABLET (4 MG TOTAL) BY MOUTH DAILY BEFORE BREAKFAST. 30 tablet 2  . glucose blood (TRUE METRIX BLOOD GLUCOSE TEST) test strip Use as instructed 100 each 12  . oxyCODONE-acetaminophen (PERCOCET/ROXICET) 5-325 MG tablet Take 1-2 tablets by mouth every 6 (six) hours as needed for moderate pain or severe pain. 20 tablet 0  . sertraline (ZOLOFT) 50 MG tablet TAKE 1 TABLET (50 MG TOTAL) BY MOUTH DAILY. 30 tablet 2  . sitaGLIPtin-metformin (JANUMET) 50-1000 MG tablet Take 1 tablet by mouth 2 (two) times daily with a meal. 60 tablet 3  . TRUEPLUS LANCETS 28G MISC 28 g by Does not apply route 4 (four) times daily. 120 each 3  . ferrous sulfate 325 (65 FE) MG EC tablet Take 325 mg by mouth 2 (two) times daily with a meal.    . traZODone (DESYREL) 100 MG tablet Take 1 tablet (100 mg total) by mouth at bedtime as needed for up to 30 days for sleep. 30 tablet 1  . predniSONE (DELTASONE) 50 MG tablet Take 1 tablet (50 mg total) by mouth daily. (Patient not taking: Reported on 07/24/2018) 5 tablet 0   No facility-administered medications prior to visit.     Allergies  Allergen Reactions  . Nsaids Other (See Comments)    AKI  . Penicillins Rash    Childhood allergy Has patient had a PCN reaction causing immediate rash, facial/tongue/throat swelling, SOB or lightheadedness with hypotension: Yes Has patient had a PCN reaction causing severe rash involving mucus membranes or skin necrosis: No Has patient had a PCN reaction that required hospitalization No Has patient had a PCN reaction occurring within the last 10 years: No If all of the above answers are "NO", then may proceed with Cephalosporin use.         Objective:    BP 139/84 (BP Location: Right Arm, Patient Position: Sitting, Cuff Size: Normal)   Pulse 94   Temp 99.5 F (37.5 C) (Oral)   Ht _0  (1.6 m)   Wt 257 lb (116.6 kg)   SpO2 97%   BMI 45.53 kg/m  Wt Readings from Last 3 Encounters:  07/24/18 257 lb (116.6 kg)  07/13/18 255 lb 6.4 oz (115.8 kg)  06/23/18 259 lb (117.5 kg)    Physical Exam Vitals signs and nursing note reviewed.  Constitutional:      Appearance: She is well-developed.  HENT:     Head: Normocephalic and atraumatic.  Neck:     Musculoskeletal: Normal range of motion.  Cardiovascular:  Rate and Rhythm: Normal rate and regular rhythm.     Heart sounds: Normal heart sounds. No murmur. No friction rub. No gallop.   Pulmonary:     Effort: Pulmonary effort is normal. No tachypnea or respiratory distress.     Breath sounds: Normal breath sounds. No decreased breath sounds, wheezing, rhonchi or rales.  Chest:     Chest wall: No tenderness.  Abdominal:     General: Bowel sounds are normal.     Palpations: Abdomen is soft.  Musculoskeletal: Normal range of motion.  Skin:    General: Skin is warm and dry.  Neurological:     Mental Status: She is alert and oriented to person, place, and time.     Coordination: Coordination normal.     Gait: Gait abnormal (using walker).  Psychiatric:        Behavior: Behavior normal. Behavior is cooperative.        Thought Content: Thought content normal.        Judgment: Judgment normal.        Patient has been counseled extensively about nutrition and exercise as well as the importance of adherence with medications and regular follow-up. The patient was given clear instructions to go to ER or return to medical center if symptoms don't improve, worsen or new problems develop. The patient verbalized understanding.   Follow-up: Return in about 3 months (around 10/22/2018).   [name], FNP-BC [facility] and [facility] [place],  [place]   07/25/2018, 9:23 PM

## 2018-07-25 ENCOUNTER — Encounter: Payer: Self-pay | Admitting: Nurse Practitioner

## 2018-07-26 ENCOUNTER — Other Ambulatory Visit (HOSPITAL_COMMUNITY)
Admission: RE | Admit: 2018-07-26 | Discharge: 2018-07-26 | Disposition: A | Discharge status: Home / Self Care | Payer: Medicaid Other | Source: Ambulatory Visit | Attending: Oncology | Admitting: Oncology

## 2018-07-26 DIAGNOSIS — D509 Iron deficiency anemia, unspecified: Secondary | ICD-10-CM | POA: Insufficient documentation

## 2018-07-26 DIAGNOSIS — E8809 Other disorders of plasma-protein metabolism, not elsewhere classified: Secondary | ICD-10-CM

## 2018-07-26 DIAGNOSIS — R768 Other specified abnormal immunological findings in serum: Secondary | ICD-10-CM

## 2018-07-26 DIAGNOSIS — C9030 Solitary plasmacytoma not having achieved remission: Secondary | ICD-10-CM | POA: Diagnosis not present

## 2018-07-26 DIAGNOSIS — M899 Disorder of bone, unspecified: Secondary | ICD-10-CM

## 2018-07-26 LAB — URINE CULTURE

## 2018-08-01 ENCOUNTER — Ambulatory Visit (INDEPENDENT_AMBULATORY_CARE_PROVIDER_SITE_OTHER): Payer: Self-pay | Admitting: Orthopaedic Surgery

## 2018-08-03 DIAGNOSIS — C9000 Multiple myeloma not having achieved remission: Secondary | ICD-10-CM

## 2018-08-08 ENCOUNTER — Other Ambulatory Visit: Payer: Self-pay | Admitting: Physician Assistant

## 2018-08-08 MED FILL — FENOFIBRATE 48 MG TABLET: 48 | 30 days supply | Qty: 30 | Fill #1

## 2018-08-08 MED FILL — ?GLIMEPIRIDE 4 MG TABLET: 4 | 30 days supply | Qty: 30 | Fill #1

## 2018-08-08 MED FILL — SERTRALINE HCL 50 MG TABS: 50 | 30 days supply | Qty: 30 | Fill #2

## 2018-08-09 ENCOUNTER — Encounter (HOSPITAL_COMMUNITY): Payer: Self-pay | Admitting: Oncology

## 2018-08-21 ENCOUNTER — Encounter: Payer: Self-pay | Admitting: Nurse Practitioner

## 2018-08-28 ENCOUNTER — Other Ambulatory Visit: Payer: Medicaid Other

## 2018-09-05 DIAGNOSIS — C9000 Multiple myeloma not having achieved remission: Secondary | ICD-10-CM

## 2018-09-07 ENCOUNTER — Other Ambulatory Visit: Payer: Self-pay | Admitting: Nurse Practitioner

## 2018-09-09 ENCOUNTER — Inpatient Hospital Stay (HOSPITAL_COMMUNITY)
Admission: AD | Admit: 2018-09-09 | Payer: Self-pay | Source: Other Acute Inpatient Hospital | Admitting: Critical Care Medicine

## 2018-09-10 MED ORDER — VICON FORTE PO CAPS
17.00 | ORAL_CAPSULE | ORAL | Status: DC
Start: ? — End: 2018-09-10

## 2018-09-10 MED ORDER — GLYCOPYRROLATE 0.2 MG/ML IJ SOLN
0.20 | INTRAMUSCULAR | Status: DC
Start: ? — End: 2018-09-10

## 2018-09-10 MED ORDER — FENTANYL CITRATE (PF) 50 MCG/ML IJ SOLN
10.00 | INTRAMUSCULAR | Status: DC
Start: ? — End: 2018-09-10

## 2018-09-10 MED ORDER — FP GLUCOSE PO
1.00 | ORAL | Status: DC
Start: ? — End: 2018-09-10

## 2018-09-10 MED ORDER — LORAZEPAM 2 MG/ML IJ SOLN
1.00 | INTRAMUSCULAR | Status: DC
Start: ? — End: 2018-09-10

## 2018-09-10 MED ORDER — LORAZEPAM 2 MG/ML IJ SOLN
2.00 | INTRAMUSCULAR | Status: DC
Start: ? — End: 2018-09-10

## 2018-09-10 MED ORDER — FENTANYL CITRATE (PF) 50 MCG/ML IJ SOLN
50.00 | INTRAMUSCULAR | Status: DC
Start: ? — End: 2018-09-10

## 2018-09-10 MED ORDER — BISACODYL 10 MG RE SUPP
10.00 | RECTAL | Status: DC
Start: ? — End: 2018-09-10

## 2018-09-28 DEATH — deceased

## 2018-10-24 ENCOUNTER — Ambulatory Visit: Payer: Medicaid Other | Admitting: Nurse Practitioner
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
